# Patient Record
Sex: Female | Born: 1940 | Race: White | Hispanic: No | Marital: Single | Smoking: Former smoker
Health system: Southern US, Community
[De-identification: ages and names within clinical notes are randomized; demographics above are authoritative.]

## PROBLEM LIST (undated history)

## (undated) DIAGNOSIS — I499 Cardiac arrhythmia, unspecified: Secondary | ICD-10-CM

## (undated) DIAGNOSIS — K219 Gastro-esophageal reflux disease without esophagitis: Secondary | ICD-10-CM

## (undated) DIAGNOSIS — I509 Heart failure, unspecified: Secondary | ICD-10-CM

## (undated) DIAGNOSIS — E785 Hyperlipidemia, unspecified: Secondary | ICD-10-CM

## (undated) DIAGNOSIS — M1990 Unspecified osteoarthritis, unspecified site: Secondary | ICD-10-CM

## (undated) DIAGNOSIS — R9431 Abnormal electrocardiogram [ECG] [EKG]: Secondary | ICD-10-CM

## (undated) DIAGNOSIS — I10 Essential (primary) hypertension: Secondary | ICD-10-CM

## (undated) DIAGNOSIS — E871 Hypo-osmolality and hyponatremia: Secondary | ICD-10-CM

## (undated) HISTORY — PX: TONSILLECTOMY: SUR1361

## (undated) HISTORY — PX: CATARACT EXTRACTION: SUR2

## (undated) HISTORY — PX: JOINT REPLACEMENT: SHX530

## (undated) HISTORY — DX: Heart failure, unspecified: I50.9

## (undated) HISTORY — PX: CATARACT EXTRACTION, BILATERAL: SHX1313

## (undated) HISTORY — PX: BREAST SURGERY: SHX581

## (undated) HISTORY — PX: BALLOON SINUPLASTY: SHX5740

## (undated) HISTORY — DX: Cardiac arrhythmia, unspecified: I49.9

## (undated) HISTORY — DX: Hyperlipidemia, unspecified: E78.5

---

## 2006-11-22 ENCOUNTER — Inpatient Hospital Stay (HOSPITAL_COMMUNITY): Admission: RE | Admit: 2006-11-22 | Discharge: 2006-11-25 | Payer: Self-pay | Admitting: Orthopedic Surgery

## 2010-10-14 NOTE — Op Note (Signed)
Erin, [name] NO.:  [id number]   MEDICAL RECORD NO.:  [id number]          PATIENT TYPE:  INP   LOCATION:  NA                           FACILITY:  [facility]   PHYSICIAN:  Erin [name], M.D.    DATE OF BIRTH:  04-04-1941   DATE OF PROCEDURE:  11/22/2006  DATE OF DISCHARGE:                               OPERATIVE REPORT   PREOPERATIVE DIAGNOSIS:  Osteoarthritis, right hip.   POSTOPERATIVE DIAGNOSIS:  Osteoarthritis, right hip.   PROCEDURE:  Right total hip arthroplasty.   SURGEON:  Erin [name], M.D.   ASSISTANT:  Erin [name], P.A.C.   ANESTHESIA:  General.   BLOOD LOSS:  300.   DRAINS:  None.   COMPLICATIONS:  None.   CONDITION:  Stable to the recovery room.   CLINICAL NOTE:  [name] is a 70 year old female with end-stage  arthritis of the right hip, progressively worsening pain and  dysfunction.  She presents now for right total hip arthroplasty.   PROCEDURE IN DETAIL:  After successful initiation of general anesthetic,  the patient was placed in the left lateral decubitus position with the  right side up and held with the hip positioner.  The right lower  extremity was isolated from the perineum with plastic drapes and prepped  and draped in the usual sterile fashion.   A short posterolateral incision was made with 10 blade through the  subcutaneous tissue to the level of the fascia lata which was incised in  line with the skin incision.  The sciatic nerve was palpated and  protected, and the short external rotators were isolated off the femur.  A capsulectomy was performed, and the hip was dislocated.  The center of  the femoral head was marked, and a trial prosthesis was placed such that  the center of the trial head corresponds to the center of her native  femoral head.  Osteotomy line was marked on the femoral neck and  osteotomy made with an oscillating saw.  The femoral head was removed  and the femur retracted  anteriorly to gain acetabular exposure.   Acetabular retractors were placed and the labrum and osteophytes  removed.  Reaming starts at 45 mm, coursing in increments of 2 to 49 mm,  and a 50-mm pinnacle acetabular shell was placed in anatomic position  and transfixed with two dome screws.  The permanent apex hole eliminator  was placed, and the permanent 36-mm neutral Ultramet liner was placed.   The femur was prepared with the canal finder and irrigation.  Axial  reaming was performed up to 15.5 mm, proximal reaming to 20Da and the  sleeve machine to a small.  The 20D small trial sleeve was placed with  20 x 15 stem and a 36 +8 neck, matching her native anteversion.  A 36 +  0 head is placed.  The hip is reduced, with excellent stability.  There  was full extension, full external rotation, 70 degrees flexion, 40  degrees adduction, 90 degrees internal rotation, 90 degrees of flexion,  70 degrees of internal rotation.  By placing the right  leg on top of the  left, it felt as though the leg lengths were equal.  The hip was then  dislocated and the trials removed.  The permanent 20D small sleeve is  placed with a 20 x 15 stem, 36 + 8 neck, matching native anteversion.  The permanent 36-0 head was placed.  The hip was reduced with the same  stability parameters.  The wound was copiously irrigated with saline  solution and the short rotators reattached to the femur through drill  holes.  The fascia lata was closed over a Hemovac drain with interrupted  #1 Vicryl.  The subcutaneous tissue was closed with #1-0 and #2-0  Vicryl, and then the subcuticular closed with running 4-0 Monocryl.  The  incision was then cleaned and dried and Steri-Strips and bulky sterile  dressing applied.  She was placed into a knee immobilizer, awakened, and  transported to recovery in stable condition.      Erin [name], M.D.  Electronically Signed     FA/MEDQ  D:  11/22/2006  T:  11/22/2006  Job:  [id number]

## 2010-10-14 NOTE — H&P (Signed)
NAMESHAMECKA, HOCUTT NO.:  [id number]   MEDICAL RECORD NO.:  [id number]          PATIENT TYPE:  INP   LOCATION:  NA                           FACILITY:  [facility]   PHYSICIAN:  Ollen Gross, M.D.    DATE OF BIRTH:  Oct 24, 1940   DATE OF ADMISSION:  11/22/2006  DATE OF DISCHARGE:                              HISTORY & PHYSICAL   CHIEF COMPLAINT:  Right hip pain.   HISTORY OF PRESENT ILLNESS:  Erin [name] is a  70 year old female who has  been evaluated Dr. Lequita Halt for ongoing right hip pain. It is  progressively getting worse over the past year now. It feels like it is  giving out on her. It is having a negative impact on her life.  She said  cortisone injection would only help temporarily.  She was seen in the  office by Dr. Lequita Halt and found to have end-stage arthritis with bone-on-  bone with erosion of femoral head and large subchondral cystic changes  in the femoral head and acetabulum.  It was felt she has reached a point  where she would benefit from undergoing surgical intervention.  Risks  and benefits been discussed. She elects to proceed with surgery.   ALLERGIES:  PENICILLIN causes hives.   CURRENT MEDICATIONS:  Lisinopril/hydrochlorothiazide 10/12 .5, aspirin  81 mg, glucosamine 1000 mg, ibuprofen 200 mg.   PAST MEDICAL HISTORY:  Mild hypertension, postmenopausal.   PAST SURGICAL HISTORY:  Breast biopsy benign.   SOCIAL HISTORY:  Divorced, Designer, jewellery, nonsmoker.  No alcohol.  Two children.  Son and some of her friends will be assisting with care  after surgery.   FAMILY HISTORY:  Brother with history of lung cancer.   REVIEW OF SYSTEMS:  GENERAL:  No fevers, chills, night sweats.  NEURO:  No seizures, syncope  or paralysis.  RESPIRATORY:  No shortness of  breath, productive cough or hemoptysis.  CARDIOVASCULAR: No chest pain,  angina or orthopnea.  GI: No nausea, vomiting, diarrhea or constipation.  GU: No dysuria, hematuria or discharge.   MUSCULOSKELETAL: Right hip.   PHYSICAL EXAM:  Vital Signs: Pulse in the 70s, respirations 12, blood  pressure 128/78.  GENERAL:  A 70 year old, petite white female, well-nourished, well-  developed, no acute distress.  Excellent historian, alert and  cooperative.  HEENT: Normocephalic, atraumatic.  Pupils round and reactive.  Oropharynx clear.  EOMs intact.  NECK:  Supple.  CHEST:  Clear.  HEART: Regular rate and rhythm with early faint systolic ejection  murmur, S1-S2 noted.  ABDOMEN: Soft, nontender.  Bowel sounds present,  rectal and genitalia not done not pertinent to present illness.  EXTREMITIES:  Right hip: right hip shows flexion 90,  internal rotation  zero, external rotation 10-15 degrees abduction.   IMPRESSION:  1. Osteoarthritis right hip  2. Mild hypertension.  3. Postmenopausal.   PLAN:  The patient admitted to [facility] to undergo a right  total hip replacement arthroplasty.  Surgery will be performed by Ollen Gross.      Alexzandrew L. Perkins, P.A.C.      Ollen Gross, M.D.  Electronically Signed    ALP/MEDQ  D:  11/21/2006  T:  11/21/2006  Job:  [id number]   cc:   Ollen Gross, M.D.  Fax: [phone]

## 2010-10-14 NOTE — Discharge Summary (Signed)
Erin [name]               ACCOUNT NO.:  [id number]   MEDICAL RECORD NO.:  [id number]          PATIENT TYPE:  INP   LOCATION:  1609                         FACILITY:  [facility]   PHYSICIAN:  Ollen Gross, M.D.    DATE OF BIRTH:  [birth year]   DATE OF ADMISSION:  11/22/2006  DATE OF DISCHARGE:  11/25/2006                               DISCHARGE SUMMARY   ADMISSION DIAGNOSES:  1. Osteoarthritis of the right hip.  2. Mild hypertension.  3. Postmenopausal.  4. Mild hyponatremia, per preoperative labs.   DISCHARGE DIAGNOSES:  1. Osteoarthritis of the right hip, status post total right hip      arthroplasty.  2. Mild postoperative blood loss anemia, did not require transfusion.  3. Preoperative hyponatremia, improving.  4. Mild hypertension.  5. Postmenopausal.  6. Mild hyponatremia, per preoperative labs.   PROCEDURE:  On November 22, 2006, right total hip surgery, Dr. Lequita Halt,  assistant Cammy Copa, PA-C, anesthesia general.   CONCLUSION:  None.   BRIEF HISTORY:  Erin [name] is a 70 year old female with end-stage  arthritis of the right hip, progressively worsening pain and  dysfunction, now presents for total hip arthroplasty.   LABORATORY DATA:  Preoperative CBC showed a hemoglobin of 14.5,  hematocrit of 42.6, white cell count of 5900 and came back up to 9800.  Last hemoglobin and hematocrit 9.7 and 28.0.  Preoperative PTT 12.6 and  26 respectively.  INR 0.9.  Serum protimes followed.  Last noted PT INR,  prior to discharge, was 50.9 and 1.2.  Chem panel on admission showed a  low sodium of 130, low chloride of 90, glucose 110, slightly elevated  ALT of 26.  Remaining chem panel within normal limits.  __________  follow electrolytes.  The sodium came back to 133, last noted at 135 and  normal level.  Preoperative urinalysis negative, __________ A positive.  X-rays, portable hip and pelvic film on November 22, 2006, status post right  total hip.   Electrocardiogram November 16, 2006, normal sinus rhythm, normal  electrocardiogram confirmed by Dr. Lacretia Nicks. Erin [name].   HOSPITAL COURSE:  The patient was admitted to [facility] and  tolerated the procedure well.  Later to recovery room on the orthopedic  floor, started on PCMP analgesia for pain control following surgery.  Did fairly well, had a good night, did have some pain and spasm.  Encouraged p.o. meds.  PCA was discontinued.  Hemoglobin was down a  little bit at a 9, probably dilutional, started on iron.  Potassium was  a little low and also felt to be dilutional, started on potassium  supplements.  We removed the Foley later that afternoon.  By the next  day, it was noted that her preoperative low sodium had already come back  up.  Potassium responded, came up to 4.5.  Dressing change, incision  looked good.  From a therapy standpoint, she did pretty well.  She was  actually getting up and walking short distances with only minimal  assistance and guarded.  She continued to progress well and by the  following day,  postoperative day 3, the incision was healing well and  she was tolerating her medications.  The sodium was normal.  The  potassium was normal.  She was discharged home.   DISCHARGE PLAN:  1. Discharge home on November 25, 2006.  2. Discharge diagnosis, please see above.  3. Discharge medications:  Coumadin, new iron, Percocet, Robaxin.  4. Diet as tolerated.   FOLLOWUP:  Follow up the week of July 7 in the office with PA.  Call for  an appointment time, any day that Monday through Thursday.   ACTIVITY:  Partial weightbearing, 25% to 50%.  Hip precautions.  Home  health physical therapy.  Home health nursing.  Total hip protocol.   DISPOSITION:  Home.   CONDITION ON DISCHARGE:  Discharged improved.      Erin [name], P.A.C.      Ollen Gross, M.D.  Electronically Signed    ALP/MEDQ  D:  11/25/2006  T:  11/25/2006  Job:  [id number]

## 2011-03-18 LAB — BASIC METABOLIC PANEL WITH GFR
BUN: 4 — ABNORMAL LOW
BUN: 8
CO2: 30
CO2: 32
Calcium: 8.6
Calcium: 8.9
Chloride: 100
Chloride: 95 — ABNORMAL LOW
Creatinine, Ser: 0.5
Creatinine, Ser: 0.65
GFR calc non Af Amer: >60
GFR calc non Af Amer: >60
Glucose, Bld: 103 — ABNORMAL HIGH
Glucose, Bld: 113 — ABNORMAL HIGH
Potassium: 3.2 — ABNORMAL LOW
Potassium: 4.2
Sodium: 135
Sodium: 137

## 2011-03-18 LAB — PROTIME-INR
INR: 0.9
INR: 1.1
INR: 1.2
Prothrombin Time: 12.6
Prothrombin Time: 14.3
Prothrombin Time: 15.8 — ABNORMAL HIGH

## 2011-03-18 LAB — URINALYSIS, ROUTINE W REFLEX MICROSCOPIC
Bilirubin Urine: NEGATIVE
Glucose, UA: NEGATIVE
Hgb urine dipstick: NEGATIVE
Ketones, ur: NEGATIVE
Nitrite: NEGATIVE
Protein, ur: NEGATIVE
Specific Gravity, Urine: 1.01
Urobilinogen, UA: 0.2
pH: 6.5

## 2011-03-18 LAB — COMPREHENSIVE METABOLIC PANEL
BUN: 6
CO2: 29
Calcium: 9.8
Chloride: 90 — ABNORMAL LOW
Creatinine, Ser: 0.61
GFR calc non Af Amer: >60
Glucose, Bld: 110 — ABNORMAL HIGH
Total Bilirubin: 0.7

## 2011-03-18 LAB — CBC
HCT: 27.6 — ABNORMAL LOW
HCT: 28 — ABNORMAL LOW
HCT: 42.6
Hemoglobin: 9.7 — ABNORMAL LOW
Hemoglobin: 9.8 — ABNORMAL LOW
MCHC: 34.1
MCHC: 34.6
MCHC: 35.2
MCHC: 35.3
MCV: 86
MCV: 96.6
MCV: 96.9
MCV: 97
Platelets: 181
Platelets: 185
RBC: 2.86 — ABNORMAL LOW
RBC: 2.89 — ABNORMAL LOW
RBC: 2.97 — ABNORMAL LOW
RBC: 4.39
RDW: 12.6
RDW: 12.9
RDW: 14.1 — ABNORMAL HIGH
WBC: 3.9 — ABNORMAL LOW
WBC: 3.9 — ABNORMAL LOW
WBC: 5.4

## 2011-03-18 LAB — CROSSMATCH: ABO/RH(D): A POS

## 2011-03-18 LAB — BASIC METABOLIC PANEL
BUN: 3 — ABNORMAL LOW
Chloride: 99
GFR calc non Af Amer: >60
Glucose, Bld: 125 — ABNORMAL HIGH
Potassium: 4.5

## 2011-03-18 LAB — APTT: aPTT: 26

## 2013-07-06 ENCOUNTER — Other Ambulatory Visit: Payer: Self-pay | Admitting: Orthopedic Surgery

## 2013-07-06 NOTE — H&P (Signed)
Erin [name]  DOB: 08-25-1940 Single / Language: Lenox Ponds[place] / Race: White Female  Date of Admission:  07-19-2013  Chief Complaint:  Severe Left Hip Pain  History of Present Illness The patient is a 73 year old female who comes in for a preoperative History and Physical. The patient is scheduled for a left total hip arthroplasty (anterior approach) to be performed by Dr. Gus RankinFrank V. Aluisio, MD at [facility] on 07-19-2013. The patient is a 73 year old female who presents today for follow up of their hip. The patient is being followed for their left hip pain and osteoarthritis. They are several week(s) out from intra-articular injection with Dr. Ethelene [name]. Symptoms reported today include: pain and difficulty ambulating. The patient feels that they are doing poorly and report their pain level to be severe. Current treatment includes: relative rest and pain medications. The following medication has been used for pain control: Tylenol with codeine. The patient has not gotten any relief of their symptoms with corticosteroid use or Cortisone injections. Erin Molelice comes in today for a recheck of her left hip pain. 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 She has been on a walker. She's also seen by Dr. Ethelene [name] for her back. She was scheduled to have an MRI but, due to the significant pain in her hip, she was unable to complete that. She has been on a Dosepak. This provided no improvement with her left hip pain. She's had no injury since we saw her the beginning of December but reports that "something is different" about the hip. She realizes she has a bad hip but wanted to come in and get it checked due to the significant increase in discomfort that she's having. Most of her pain is related in her groin and radiating down the anterior thigh. She is finding it very difficult to weight bear. her pain has progressed tot hte point where she is  now essentially wheelchair bound to get around. Family has come in to help her at home and to be with her following her surgery. She is ready to get the left hip replaced now. They have been treated conservatively in the past for the above stated problem and despite conservative measures, they continue to have progressive pain and severe functional limitations and dysfunction. They have failed non-operative management including home exercise, medications, and injections. It is felt that they would benefit from undergoing total joint replacement. Risks and benefits of the procedure have been discussed with the patient and they elect to proceed with surgery. There are no active contraindications to surgery such as ongoing infection or rapidly progressive neurological disease.  Allergies Penicillins. 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 Doing very well with regards to the right hip surgery. 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 Mother. Osteoarthritis. Brother.  Social History Children. 2 Current drinker. 04/08/2013: Currently drinks beer less than 5 times per week Current work status. retired - Audiological scientistN Exercise. Exercises weekly; does gym / weights Living situation. live alone Marital status. divorced Number of flights of stairs before winded. greater than 5 Tobacco use. Former smoker. 04/08/2013: smoke(d) 3/4 pack(s) per day Not under pain contract No history of drug/alcohol rehab Tobacco / smoke exposure. 04/08/2013: no Post-Surgical Plans. Plan for home with family  Medication History Lisinopril (40MG   Tablet, 1 Oral daily) Active. Aspirin (81MG  Tablet, 1 Oral daily) Active. Allegra Allergy (180MG  Tablet, 1 Oral daily) Active.  Past Surgical  History Total Hip Replacement. right Cataract Surgery. Date: 2013. bilateral  Review of Systems General:Not Present- Chills, Fever, Night Sweats, Fatigue, Weight Gain, Weight Loss and Memory Loss. Skin:Not Present- Hives, Itching, Rash, Eczema and Lesions. HEENT:Not Present- Tinnitus, Headache, Double Vision, Visual Loss, Hearing Loss and Dentures. Respiratory:Not Present- Shortness of breath with exertion, Shortness of breath at rest, Allergies, Coughing up blood and Chronic Cough. Cardiovascular:Not Present- Chest Pain, Racing/skipping heartbeats, Difficulty Breathing Lying Down, Murmur, Swelling and Palpitations. Gastrointestinal:Not Present- Bloody Stool, Heartburn, Abdominal Pain, Vomiting, Nausea, Constipation, Diarrhea, Difficulty Swallowing, Jaundice and Loss of appetitie. Female Genitourinary:Not Present- Blood in Urine, Urinary frequency, Weak urinary stream, Discharge, Flank Pain, Incontinence, Painful Urination, Urgency, Urinary Retention and Urinating at Night. Musculoskeletal:Present- Joint Pain (left hip). Not Present- Muscle Weakness, Muscle Pain, Joint Swelling, Back Pain, Morning Stiffness and Spasms. Neurological:Not Present- Tremor, Dizziness, Blackout spells, Paralysis, Difficulty with balance and Weakness. Psychiatric:Not Present- Insomnia.   Vitals Pulse: 86 (Regular) Resp.: 12 (Unlabored) BP: 126/64 (Sitting, Left Arm, Standard)  Physical Exam The physical exam findings are as follows:   General Mental Status - Alert, cooperative and good historian. General Appearance- pleasant. Not in acute distress. Orientation- Oriented X3. Build & Nutrition- Petite, Well nourished and Well developed. Gait- Use of assistive device (essemtially wheelchair bound at this time due to the progression of her hip).   Head and Neck Head- normocephalic, atraumatic . Neck Global Assessment- supple. no bruit auscultated on the right and no bruit  auscultated on the left.   Eye Vision- Wears corrective lenses. Pupil- Bilateral- Regular and Round. Motion- Bilateral- EOMI.   Chest and Lung Exam Auscultation: Breath sounds:- clear at anterior chest wall and - clear at posterior chest wall. Adventitious sounds:- No Adventitious sounds.   Cardiovascular Auscultation:Rhythm- Regular rate and rhythm (with the exception of occassional extra or ectopic beat noted on exam). Heart Sounds- S1 WNL and S2 WNL. Murmurs & Other Heart Sounds:Auscultation of the heart reveals - No Murmurs.   Abdomen Palpation/Percussion:Tenderness- Abdomen is non-tender to palpation. Rigidity (guarding)- Abdomen is soft. Auscultation:Auscultation of the abdomen reveals - Bowel sounds normal.   Female Genitourinary Not done, not pertinent to present illness  Musculoskeletal  On physical exam, she has significant discomfort with passive and active ROM. Due to the significant discomfort, ROM was not measured. She is able to fully extend the knee without difficulty. No effusion or instability about the knee. Calves are soft and nontender. Distal pulses are 2+. She does have normal painless ROM in the right hip. No specific tenderness to palpation over the greater trochanteric bursa on either side.  Radiographs - she's had extreme progression of this disease to the point where she has essentially completely necrosed the femoral head on that left side. No fractures noted.   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  Note: Plan is for a Left Total Hip Replacement - Anterior Approach by Dr. Lequita Halt.  Plan is to go ome with family  PCP - Dr. Gavin Potters  The patient does not have any contraindications and will receive TXA (tranexamic acid) prior to surgery.  Signed electronically by Lauraine Rinne, III PA-C

## 2013-07-10 ENCOUNTER — Encounter (HOSPITAL_COMMUNITY): Payer: Self-pay | Admitting: Pharmacy Technician

## 2013-07-13 ENCOUNTER — Ambulatory Visit (HOSPITAL_COMMUNITY)
Admission: RE | Admit: 2013-07-13 | Discharge: 2013-07-13 | Disposition: A | Discharge status: Home / Self Care | Payer: Medicare Other | Source: Ambulatory Visit | Attending: Orthopedic Surgery | Admitting: Orthopedic Surgery

## 2013-07-13 ENCOUNTER — Encounter (HOSPITAL_COMMUNITY)
Admission: RE | Admit: 2013-07-13 | Discharge: 2013-07-13 | Disposition: A | Discharge status: Home / Self Care | Payer: Medicare Other | Source: Ambulatory Visit | Attending: Orthopedic Surgery | Admitting: Orthopedic Surgery

## 2013-07-13 ENCOUNTER — Encounter (HOSPITAL_COMMUNITY): Payer: Self-pay

## 2013-07-13 DIAGNOSIS — Z01818 Encounter for other preprocedural examination: Secondary | ICD-10-CM | POA: Insufficient documentation

## 2013-07-13 DIAGNOSIS — M1990 Unspecified osteoarthritis, unspecified site: Secondary | ICD-10-CM

## 2013-07-13 DIAGNOSIS — R9431 Abnormal electrocardiogram [ECG] [EKG]: Secondary | ICD-10-CM

## 2013-07-13 DIAGNOSIS — E871 Hypo-osmolality and hyponatremia: Secondary | ICD-10-CM

## 2013-07-13 DIAGNOSIS — M1610 Unilateral primary osteoarthritis, unspecified hip: Secondary | ICD-10-CM | POA: Insufficient documentation

## 2013-07-13 DIAGNOSIS — Z01812 Encounter for preprocedural laboratory examination: Secondary | ICD-10-CM | POA: Insufficient documentation

## 2013-07-13 DIAGNOSIS — M169 Osteoarthritis of hip, unspecified: Secondary | ICD-10-CM | POA: Insufficient documentation

## 2013-07-13 HISTORY — DX: Abnormal electrocardiogram (ECG) (EKG): R94.31

## 2013-07-13 HISTORY — DX: Gastro-esophageal reflux disease without esophagitis: K21.9

## 2013-07-13 HISTORY — DX: Essential (primary) hypertension: I10

## 2013-07-13 HISTORY — DX: Hypo-osmolality and hyponatremia: E87.1

## 2013-07-13 HISTORY — DX: Unspecified osteoarthritis, unspecified site: M19.90

## 2013-07-13 LAB — COMPREHENSIVE METABOLIC PANEL
ALK PHOS: 67 U/L (ref 39–117)
ALT: 21 U/L (ref 0–35)
AST: 17 U/L (ref 0–37)
Albumin: 3.9 g/dL (ref 3.5–5.2)
BILIRUBIN TOTAL: 0.3 mg/dL (ref 0.3–1.2)
BUN: 11 mg/dL (ref 6–23)
CO2: 25 meq/L (ref 19–32)
Calcium: 10.1 mg/dL (ref 8.4–10.5)
Chloride: 89 mEq/L — ABNORMAL LOW (ref 96–112)
Creatinine, Ser: 0.58 mg/dL (ref 0.50–1.10)
GFR calc non Af Amer: 89 mL/min — ABNORMAL LOW (ref >90)
GLUCOSE: 111 mg/dL — AB (ref 70–99)
POTASSIUM: 4.7 meq/L (ref 3.7–5.3)
Sodium: 130 mEq/L — ABNORMAL LOW (ref 137–147)
Total Protein: 7.1 g/dL (ref 6.0–8.3)

## 2013-07-13 LAB — URINALYSIS, ROUTINE W REFLEX MICROSCOPIC
Bilirubin Urine: NEGATIVE
Glucose, UA: NEGATIVE mg/dL
HGB URINE DIPSTICK: NEGATIVE
Ketones, ur: NEGATIVE mg/dL
Leukocytes, UA: NEGATIVE
NITRITE: NEGATIVE
Protein, ur: NEGATIVE mg/dL
SPECIFIC GRAVITY, URINE: 1.019 (ref 1.005–1.030)
Urobilinogen, UA: 0.2 mg/dL (ref 0.0–1.0)
pH: 7 (ref 5.0–8.0)

## 2013-07-13 LAB — CBC
HEMATOCRIT: 38.4 % (ref 36.0–46.0)
HEMOGLOBIN: 13.2 g/dL (ref 12.0–15.0)
MCH: 32.1 pg (ref 26.0–34.0)
MCHC: 34.4 g/dL (ref 30.0–36.0)
MCV: 93.4 fL (ref 78.0–100.0)
Platelets: 275 10*3/uL (ref 150–400)
RBC: 4.11 MIL/uL (ref 3.87–5.11)
RDW: 12.9 % (ref 11.5–15.5)
WBC: 6.6 10*3/uL (ref 4.0–10.5)

## 2013-07-13 LAB — SURGICAL PCR SCREEN
MRSA, PCR: NEGATIVE
Staphylococcus aureus: NEGATIVE

## 2013-07-13 LAB — PROTIME-INR
INR: 0.93 (ref 0.00–1.49)
PROTHROMBIN TIME: 12.3 s (ref 11.6–15.2)

## 2013-07-13 LAB — APTT: APTT: 33 s (ref 24–37)

## 2013-07-13 NOTE — Pre-Procedure Instructions (Addendum)
07-13-13 EKG 12'14,Echo 1'15-reports with chart. CXR today. Left hip Xray today.

## 2013-07-13 NOTE — Progress Notes (Signed)
07-13-13 1620 labs viewable in Epic-please note Sodium.

## 2013-07-13 NOTE — Patient Instructions (Addendum)
20 Timmothy Eulerlice S Alcantar  07/13/2013   Your procedure is scheduled on:2-18   -2015  [facility] at    2:00 PM.  Call this number if you have problems the morning of surgery: [phone]  Or Presurgical Testing [phone](Lennyn Bellanca) For Living Will and/or Health Care Power Attorney Forms: please provide copy for your medical record,may bring AM of surgery(Forms should be already notarized -we do not provide this service).  Remember: Follow any bowel prep instructions per MD office. For Cpap use: Bring mask and tubing only.   Do not eat food:After Midnight.  May have clear liquids:up to 6 Hours before arrival. Nothing after : 1100 AM  Clear liquids include soda, tea, black coffee, apple or grape juice, broth.  Take these medicines the morning of surgery with A SIP OF WATER: Tylenol 3#, Allegra, Fentanyl Transdermal Patch   Do not wear jewelry, make-up or nail polish.  Do not wear lotions, powders, or perfumes. You may wear deodorant.  Do not shave 48 hours(2 days) prior to first CHG shower(legs and under arms).(Shaving face and neck okay.)  Do not bring valuables to the hospital.(Hospital is not responsible for lost valuables).  Contacts, dentures or removable bridgework, body piercing, hair pins may not be worn into surgery.  Leave suitcase in the car. After surgery it may be brought to your room.  For patients admitted to the hospital, checkout time is 11:00 AM the day of discharge.(Restricted visitors-Any Persons displaying flu-like symptoms or illness).    Patients discharged the day of surgery will not be allowed to drive home. Must have responsible person with you x 24 hours once discharged.  Name and phone number of your driver: Esmeralda ArthurPam Klein, sister-in-law- will provide #  Special Instructions: CHG(Chlorhedine 4%-"Hibiclens","Betasept","Aplicare") Shower Use Special Wash: see special instructions.(avoid face and genitals)   Please read over the following fact sheets  that you were given: MRSA Information, Blood Transfusion fact sheet, Incentive Spirometry Instruction.  Remember : Type/Screen "Blue armbands" - may not be removed once applied(would result in being retested AM of surgery, if removed).  Failure to follow these instructions may result in Cancellation of your surgery.   Patient signature_______________________________________________________

## 2013-07-19 ENCOUNTER — Inpatient Hospital Stay (HOSPITAL_COMMUNITY): Payer: Medicare Other | Admitting: Anesthesiology

## 2013-07-19 ENCOUNTER — Inpatient Hospital Stay (HOSPITAL_COMMUNITY): Payer: Medicare Other

## 2013-07-19 ENCOUNTER — Encounter (HOSPITAL_COMMUNITY): Payer: Self-pay | Admitting: General Practice

## 2013-07-19 ENCOUNTER — Encounter (HOSPITAL_COMMUNITY): Payer: Medicare Other | Admitting: Anesthesiology

## 2013-07-19 ENCOUNTER — Inpatient Hospital Stay (HOSPITAL_COMMUNITY)
Admission: RE | Admit: 2013-07-19 | Discharge: 2013-07-21 | DRG: 470 | Disposition: A | Discharge status: Home Health | Payer: Medicare Other | Source: Ambulatory Visit | Attending: Orthopedic Surgery | Admitting: Orthopedic Surgery

## 2013-07-19 ENCOUNTER — Encounter (HOSPITAL_COMMUNITY)
Admission: RE | Disposition: A | Discharge status: Home Health | Payer: Self-pay | Source: Ambulatory Visit | Attending: Orthopedic Surgery

## 2013-07-19 DIAGNOSIS — I10 Essential (primary) hypertension: Secondary | ICD-10-CM | POA: Diagnosis present

## 2013-07-19 DIAGNOSIS — M169 Osteoarthritis of hip, unspecified: Secondary | ICD-10-CM | POA: Diagnosis present

## 2013-07-19 DIAGNOSIS — E871 Hypo-osmolality and hyponatremia: Secondary | ICD-10-CM | POA: Diagnosis present

## 2013-07-19 DIAGNOSIS — M1610 Unilateral primary osteoarthritis, unspecified hip: Principal | ICD-10-CM | POA: Diagnosis present

## 2013-07-19 DIAGNOSIS — D62 Acute posthemorrhagic anemia: Secondary | ICD-10-CM | POA: Diagnosis not present

## 2013-07-19 DIAGNOSIS — K219 Gastro-esophageal reflux disease without esophagitis: Secondary | ICD-10-CM | POA: Diagnosis present

## 2013-07-19 DIAGNOSIS — Z96642 Presence of left artificial hip joint: Secondary | ICD-10-CM

## 2013-07-19 DIAGNOSIS — R9431 Abnormal electrocardiogram [ECG] [EKG]: Secondary | ICD-10-CM | POA: Diagnosis present

## 2013-07-19 DIAGNOSIS — IMO0002 Reserved for concepts with insufficient information to code with codable children: Secondary | ICD-10-CM

## 2013-07-19 DIAGNOSIS — Z87891 Personal history of nicotine dependence: Secondary | ICD-10-CM

## 2013-07-19 HISTORY — PX: TOTAL HIP ARTHROPLASTY: SHX124

## 2013-07-19 LAB — TYPE AND SCREEN
ABO/RH(D): A POS
Antibody Screen: NEGATIVE

## 2013-07-19 SURGERY — ARTHROPLASTY, HIP, TOTAL, ANTERIOR APPROACH
Anesthesia: General | Site: Hip | Laterality: Left

## 2013-07-19 MED ORDER — MORPHINE SULFATE 2 MG/ML IJ SOLN: 1.0000 mg | INTRAMUSCULAR | Status: DC | PRN

## 2013-07-19 MED ORDER — ACETAMINOPHEN 325 MG PO TABS: 650.0000 mg | ORAL_TABLET | Freq: Four times a day (QID) | ORAL | Status: DC | PRN

## 2013-07-19 MED ORDER — METHOCARBAMOL 500 MG PO TABS
500.0000 mg | ORAL_TABLET | Freq: Four times a day (QID) | ORAL | Status: DC | PRN
  Administered 2013-07-20 – 2013-07-21 (×4): 500 mg via ORAL
  Filled 2013-07-19 (×5): qty 1

## 2013-07-19 MED ORDER — BUPIVACAINE LIPOSOME 1.3 % IJ SUSP
INTRAMUSCULAR | Status: DC | PRN
  Administered 2013-07-19: 20 mL

## 2013-07-19 MED ORDER — BISACODYL 10 MG RE SUPP: 10.0000 mg | Freq: Every day | RECTAL | Status: DC | PRN

## 2013-07-19 MED ORDER — DEXAMETHASONE SODIUM PHOSPHATE 10 MG/ML IJ SOLN
10.0000 mg | Freq: Every day | INTRAMUSCULAR | Status: AC
  Filled 2013-07-19: qty 1

## 2013-07-19 MED ORDER — OXYCODONE HCL 5 MG PO TABS
5.0000 mg | ORAL_TABLET | ORAL | Status: DC | PRN
  Administered 2013-07-19: 5 mg via ORAL
  Administered 2013-07-20: 10 mg via ORAL
  Administered 2013-07-20: 5 mg via ORAL
  Administered 2013-07-20 (×2): 10 mg via ORAL
  Administered 2013-07-20: 5 mg via ORAL
  Administered 2013-07-20 – 2013-07-21 (×4): 10 mg via ORAL
  Filled 2013-07-19 (×9): qty 2

## 2013-07-19 MED ORDER — SODIUM CHLORIDE 0.9 % IJ SOLN
INTRAMUSCULAR | Status: AC
  Filled 2013-07-19: qty 50

## 2013-07-19 MED ORDER — KETOROLAC TROMETHAMINE 15 MG/ML IJ SOLN
7.5000 mg | Freq: Four times a day (QID) | INTRAMUSCULAR | Status: AC | PRN
  Administered 2013-07-20: 7.5 mg via INTRAVENOUS
  Filled 2013-07-19: qty 1

## 2013-07-19 MED ORDER — ATROPINE SULFATE 0.4 MG/ML IJ SOLN
INTRAMUSCULAR | Status: AC
  Filled 2013-07-19: qty 1

## 2013-07-19 MED ORDER — DEXAMETHASONE 4 MG PO TABS
10.0000 mg | ORAL_TABLET | Freq: Every day | ORAL | Status: AC
  Administered 2013-07-20: 10 mg via ORAL
  Filled 2013-07-19: qty 1

## 2013-07-19 MED ORDER — EPHEDRINE SULFATE 50 MG/ML IJ SOLN
INTRAMUSCULAR | Status: AC
  Filled 2013-07-19: qty 1

## 2013-07-19 MED ORDER — HYDROMORPHONE HCL PF 1 MG/ML IJ SOLN
INTRAMUSCULAR | Status: DC | PRN
  Administered 2013-07-19 (×2): 1 mg via INTRAVENOUS

## 2013-07-19 MED ORDER — FLEET ENEMA 7-19 GM/118ML RE ENEM: 1.0000 | ENEMA | Freq: Once | RECTAL | Status: AC | PRN

## 2013-07-19 MED ORDER — PROPOFOL 10 MG/ML IV BOLUS
INTRAVENOUS | Status: DC | PRN
  Administered 2013-07-19: 150 mg via INTRAVENOUS
  Administered 2013-07-19: 50 mg via INTRAVENOUS

## 2013-07-19 MED ORDER — ONDANSETRON HCL 4 MG/2ML IJ SOLN
INTRAMUSCULAR | Status: AC
  Filled 2013-07-19: qty 2

## 2013-07-19 MED ORDER — DOCUSATE SODIUM 100 MG PO CAPS
100.0000 mg | ORAL_CAPSULE | Freq: Two times a day (BID) | ORAL | Status: DC
  Administered 2013-07-19 – 2013-07-20 (×3): 100 mg via ORAL

## 2013-07-19 MED ORDER — SODIUM CHLORIDE 0.9 % IV SOLN
INTRAVENOUS | Status: DC
Start: 2013-07-19 — End: 2013-07-19

## 2013-07-19 MED ORDER — ONDANSETRON HCL 4 MG/2ML IJ SOLN
INTRAMUSCULAR | Status: DC | PRN
  Administered 2013-07-19: 4 mg via INTRAVENOUS

## 2013-07-19 MED ORDER — METHOCARBAMOL 100 MG/ML IJ SOLN
500.0000 mg | Freq: Four times a day (QID) | INTRAVENOUS | Status: DC | PRN
  Administered 2013-07-19 – 2013-07-20 (×2): 500 mg via INTRAVENOUS
  Filled 2013-07-19 (×2): qty 5

## 2013-07-19 MED ORDER — CHLORHEXIDINE GLUCONATE CLOTH 2 % EX PADS
6.0000 | MEDICATED_PAD | Freq: Once | CUTANEOUS | Status: DC
Start: 2013-07-19 — End: 2013-07-19

## 2013-07-19 MED ORDER — VANCOMYCIN HCL IN DEXTROSE 1-5 GM/200ML-% IV SOLN
1000.0000 mg | Freq: Once | INTRAVENOUS | Status: AC
  Administered 2013-07-19: 1000 mg via INTRAVENOUS

## 2013-07-19 MED ORDER — NEOSTIGMINE METHYLSULFATE 1 MG/ML IJ SOLN
INTRAMUSCULAR | Status: DC | PRN
  Administered 2013-07-19: 4 mg via INTRAVENOUS

## 2013-07-19 MED ORDER — FENTANYL CITRATE 0.05 MG/ML IJ SOLN
25.0000 ug | INTRAMUSCULAR | Status: DC | PRN
  Administered 2013-07-19 (×4): 25 ug via INTRAVENOUS

## 2013-07-19 MED ORDER — PROPOFOL 10 MG/ML IV BOLUS
INTRAVENOUS | Status: AC
  Filled 2013-07-19: qty 20

## 2013-07-19 MED ORDER — RIVAROXABAN 10 MG PO TABS
10.0000 mg | ORAL_TABLET | Freq: Every day | ORAL | Status: DC
  Administered 2013-07-20 – 2013-07-21 (×2): 10 mg via ORAL
  Filled 2013-07-19 (×3): qty 1

## 2013-07-19 MED ORDER — DEXAMETHASONE SODIUM PHOSPHATE 10 MG/ML IJ SOLN
INTRAMUSCULAR | Status: AC
  Filled 2013-07-19: qty 1

## 2013-07-19 MED ORDER — GLYCOPYRROLATE 0.2 MG/ML IJ SOLN
INTRAMUSCULAR | Status: DC | PRN
  Administered 2013-07-19: 0.6 mg via INTRAVENOUS

## 2013-07-19 MED ORDER — PHENOL 1.4 % MT LIQD: 1.0000 | OROMUCOSAL | Status: DC | PRN

## 2013-07-19 MED ORDER — VANCOMYCIN HCL IN DEXTROSE 1-5 GM/200ML-% IV SOLN
INTRAVENOUS | Status: AC
  Filled 2013-07-19: qty 200

## 2013-07-19 MED ORDER — SUCCINYLCHOLINE CHLORIDE 20 MG/ML IJ SOLN
INTRAMUSCULAR | Status: DC | PRN
  Administered 2013-07-19: 100 mg via INTRAVENOUS

## 2013-07-19 MED ORDER — POLYETHYLENE GLYCOL 3350 17 G PO PACK: 17.0000 g | PACK | Freq: Every day | ORAL | Status: DC | PRN

## 2013-07-19 MED ORDER — BUPIVACAINE HCL (PF) 0.25 % IJ SOLN
INTRAMUSCULAR | Status: AC
Start: 2013-07-19 — End: 2013-07-19
  Filled 2013-07-19: qty 30

## 2013-07-19 MED ORDER — VANCOMYCIN HCL IN DEXTROSE 1-5 GM/200ML-% IV SOLN
1000.0000 mg | Freq: Two times a day (BID) | INTRAVENOUS | Status: AC
  Administered 2013-07-20: 1000 mg via INTRAVENOUS
  Filled 2013-07-19: qty 200

## 2013-07-19 MED ORDER — LABETALOL HCL 5 MG/ML IV SOLN
INTRAVENOUS | Status: AC
  Filled 2013-07-19: qty 4

## 2013-07-19 MED ORDER — METOCLOPRAMIDE HCL 5 MG/ML IJ SOLN: 5.0000 mg | Freq: Three times a day (TID) | INTRAMUSCULAR | Status: DC | PRN

## 2013-07-19 MED ORDER — FENTANYL CITRATE 0.05 MG/ML IJ SOLN
INTRAMUSCULAR | Status: DC | PRN
  Administered 2013-07-19: 100 ug via INTRAVENOUS
  Administered 2013-07-19: 50 ug via INTRAVENOUS
  Administered 2013-07-19: 100 ug via INTRAVENOUS

## 2013-07-19 MED ORDER — TRANEXAMIC ACID 100 MG/ML IV SOLN
1000.0000 mg | INTRAVENOUS | Status: AC
  Administered 2013-07-19: 1000 mg via INTRAVENOUS
  Filled 2013-07-19: qty 10

## 2013-07-19 MED ORDER — ACETAMINOPHEN 650 MG RE SUPP: 650.0000 mg | Freq: Four times a day (QID) | RECTAL | Status: DC | PRN

## 2013-07-19 MED ORDER — FENTANYL CITRATE 0.05 MG/ML IJ SOLN
INTRAMUSCULAR | Status: AC
  Filled 2013-07-19: qty 2

## 2013-07-19 MED ORDER — KETAMINE HCL 10 MG/ML IJ SOLN
INTRAMUSCULAR | Status: AC
  Filled 2013-07-19: qty 1

## 2013-07-19 MED ORDER — TRAMADOL HCL 50 MG PO TABS: 50.0000 mg | ORAL_TABLET | Freq: Four times a day (QID) | ORAL | Status: DC | PRN

## 2013-07-19 MED ORDER — METOCLOPRAMIDE HCL 10 MG PO TABS: 5.0000 mg | ORAL_TABLET | Freq: Three times a day (TID) | ORAL | Status: DC | PRN

## 2013-07-19 MED ORDER — DIPHENHYDRAMINE HCL 12.5 MG/5ML PO ELIX: 12.5000 mg | ORAL_SOLUTION | ORAL | Status: DC | PRN

## 2013-07-19 MED ORDER — MIDAZOLAM HCL 5 MG/5ML IJ SOLN
INTRAMUSCULAR | Status: DC | PRN
  Administered 2013-07-19: 2 mg via INTRAVENOUS

## 2013-07-19 MED ORDER — MIDAZOLAM HCL 2 MG/2ML IJ SOLN
INTRAMUSCULAR | Status: AC
  Filled 2013-07-19: qty 2

## 2013-07-19 MED ORDER — SODIUM CHLORIDE 0.9 % IV SOLN
INTRAVENOUS | Status: DC
  Administered 2013-07-19: 22:00:00 via INTRAVENOUS

## 2013-07-19 MED ORDER — HYDROMORPHONE HCL PF 1 MG/ML IJ SOLN
0.2500 mg | INTRAMUSCULAR | Status: DC | PRN
  Administered 2013-07-19 (×4): 0.5 mg via INTRAVENOUS

## 2013-07-19 MED ORDER — LIDOCAINE HCL (CARDIAC) 20 MG/ML IV SOLN
INTRAVENOUS | Status: AC
  Filled 2013-07-19: qty 5

## 2013-07-19 MED ORDER — CHLORHEXIDINE GLUCONATE 4 % EX LIQD: 60.0000 mL | Freq: Once | CUTANEOUS | Status: DC

## 2013-07-19 MED ORDER — DEXAMETHASONE SODIUM PHOSPHATE 10 MG/ML IJ SOLN
10.0000 mg | Freq: Once | INTRAMUSCULAR | Status: AC
  Administered 2013-07-19: 10 mg via INTRAVENOUS

## 2013-07-19 MED ORDER — ACETAMINOPHEN 500 MG PO TABS
1000.0000 mg | ORAL_TABLET | Freq: Four times a day (QID) | ORAL | Status: AC
  Administered 2013-07-20: 1000 mg via ORAL
  Filled 2013-07-19 (×2): qty 2

## 2013-07-19 MED ORDER — HYDROMORPHONE HCL PF 2 MG/ML IJ SOLN
INTRAMUSCULAR | Status: AC
  Filled 2013-07-19: qty 1

## 2013-07-19 MED ORDER — FENTANYL 50 MCG/HR TD PT72: 50.0000 ug | MEDICATED_PATCH | TRANSDERMAL | Status: DC

## 2013-07-19 MED ORDER — PROMETHAZINE HCL 25 MG/ML IJ SOLN: 6.2500 mg | INTRAMUSCULAR | Status: DC | PRN

## 2013-07-19 MED ORDER — MORPHINE SULFATE 10 MG/ML IJ SOLN: 2.0000 mg | INTRAMUSCULAR | Status: DC

## 2013-07-19 MED ORDER — CEFAZOLIN SODIUM-DEXTROSE 2-3 GM-% IV SOLR: 2.0000 g | INTRAVENOUS | Status: DC

## 2013-07-19 MED ORDER — ONDANSETRON HCL 4 MG/2ML IJ SOLN
4.0000 mg | Freq: Four times a day (QID) | INTRAMUSCULAR | Status: DC | PRN
  Administered 2013-07-20: 4 mg via INTRAVENOUS
  Filled 2013-07-19: qty 2

## 2013-07-19 MED ORDER — LIDOCAINE HCL (PF) 2 % IJ SOLN
INTRAMUSCULAR | Status: DC | PRN
  Administered 2013-07-19: 75 mg via INTRADERMAL

## 2013-07-19 MED ORDER — LORATADINE 10 MG PO TABS
10.0000 mg | ORAL_TABLET | Freq: Every day | ORAL | Status: DC
  Administered 2013-07-20 – 2013-07-21 (×2): 10 mg via ORAL
  Filled 2013-07-19 (×2): qty 1

## 2013-07-19 MED ORDER — SODIUM CHLORIDE 0.9 % IJ SOLN
INTRAMUSCULAR | Status: AC
  Filled 2013-07-19: qty 10

## 2013-07-19 MED ORDER — MORPHINE SULFATE 10 MG/ML IJ SOLN
2.0000 mg | INTRAMUSCULAR | Status: DC | PRN
  Administered 2013-07-19 (×2): 2 mg via INTRAVENOUS

## 2013-07-19 MED ORDER — MENTHOL 3 MG MT LOZG
1.0000 | LOZENGE | OROMUCOSAL | Status: DC | PRN
  Filled 2013-07-19: qty 9

## 2013-07-19 MED ORDER — SODIUM CHLORIDE 0.9 % IJ SOLN
INTRAMUSCULAR | Status: DC | PRN
  Administered 2013-07-19: 30 mL via INTRAVENOUS

## 2013-07-19 MED ORDER — ROCURONIUM BROMIDE 100 MG/10ML IV SOLN
INTRAVENOUS | Status: DC | PRN
  Administered 2013-07-19: 10 mg via INTRAVENOUS
  Administered 2013-07-19: 40 mg via INTRAVENOUS

## 2013-07-19 MED ORDER — HYDROMORPHONE HCL PF 1 MG/ML IJ SOLN
INTRAMUSCULAR | Status: AC
  Filled 2013-07-19: qty 1

## 2013-07-19 MED ORDER — BUPIVACAINE HCL (PF) 0.25 % IJ SOLN
INTRAMUSCULAR | Status: DC | PRN
  Administered 2013-07-19: 20 mL

## 2013-07-19 MED ORDER — BUPIVACAINE LIPOSOME 1.3 % IJ SUSP
20.0000 mL | Freq: Once | INTRAMUSCULAR | Status: DC
  Filled 2013-07-19: qty 20

## 2013-07-19 MED ORDER — FENTANYL CITRATE 0.05 MG/ML IJ SOLN
INTRAMUSCULAR | Status: AC
  Administered 2013-07-19: 25 ug
  Filled 2013-07-19: qty 2

## 2013-07-19 MED ORDER — LABETALOL HCL 5 MG/ML IV SOLN
INTRAVENOUS | Status: DC | PRN
  Administered 2013-07-19: 2.5 mg via INTRAVENOUS

## 2013-07-19 MED ORDER — ONDANSETRON HCL 4 MG PO TABS
4.0000 mg | ORAL_TABLET | Freq: Four times a day (QID) | ORAL | Status: DC | PRN
  Administered 2013-07-20 – 2013-07-21 (×3): 4 mg via ORAL
  Filled 2013-07-19 (×4): qty 1

## 2013-07-19 MED ORDER — FENTANYL CITRATE 0.05 MG/ML IJ SOLN
INTRAMUSCULAR | Status: AC
  Filled 2013-07-19: qty 5

## 2013-07-19 MED ORDER — MORPHINE SULFATE 10 MG/ML IJ SOLN
INTRAMUSCULAR | Status: AC
  Filled 2013-07-19: qty 1

## 2013-07-19 MED ORDER — GLYCOPYRROLATE 0.2 MG/ML IJ SOLN
INTRAMUSCULAR | Status: AC
  Filled 2013-07-19: qty 3

## 2013-07-19 MED ORDER — ACETAMINOPHEN 10 MG/ML IV SOLN
1000.0000 mg | Freq: Once | INTRAVENOUS | Status: AC
  Administered 2013-07-19: 1000 mg via INTRAVENOUS
  Filled 2013-07-19: qty 100

## 2013-07-19 MED ORDER — LACTATED RINGERS IV SOLN
INTRAVENOUS | Status: DC
  Administered 2013-07-19 (×2): via INTRAVENOUS
  Administered 2013-07-19: 1000 mL via INTRAVENOUS

## 2013-07-19 SURGICAL SUPPLY — 41 items
BAG ZIPLOCK 12X15 (MISCELLANEOUS) IMPLANT
BLADE SAW SGTL 18X1.27X75 (BLADE) ×2 IMPLANT
BLADE SAW SGTL 18X1.27X75MM (BLADE) ×1
CAPT HIP PF MOP ×3 IMPLANT
CLOSURE WOUND 1/2 X4 (GAUZE/BANDAGES/DRESSINGS) ×1
DECANTER SPIKE VIAL GLASS SM (MISCELLANEOUS) ×3 IMPLANT
DRAPE C-ARM 42X120 X-RAY (DRAPES) ×3 IMPLANT
DRAPE STERI IOBAN 125X83 (DRAPES) ×3 IMPLANT
DRAPE U-SHAPE 47X51 STRL (DRAPES) ×9 IMPLANT
DRSG ADAPTIC 3X8 NADH LF (GAUZE/BANDAGES/DRESSINGS) ×3 IMPLANT
DRSG MEPILEX BORDER 4X4 (GAUZE/BANDAGES/DRESSINGS) ×3 IMPLANT
DRSG MEPILEX BORDER 4X8 (GAUZE/BANDAGES/DRESSINGS) ×3 IMPLANT
DURAPREP 26ML APPLICATOR (WOUND CARE) ×3 IMPLANT
ELECT BLADE 6.5 EXT (BLADE) ×3 IMPLANT
ELECT REM PT RETURN 9FT ADLT (ELECTROSURGICAL) ×3
ELECTRODE REM PT RTRN 9FT ADLT (ELECTROSURGICAL) ×1 IMPLANT
EVACUATOR 1/8 PVC DRAIN (DRAIN) ×3 IMPLANT
FACESHIELD LNG OPTICON STERILE (SAFETY) ×12 IMPLANT
GLOVE BIO SURGEON STRL SZ7.5 (GLOVE) ×3 IMPLANT
GLOVE BIO SURGEON STRL SZ8 (GLOVE) ×6 IMPLANT
GLOVE BIOGEL PI IND STRL 8 (GLOVE) ×2 IMPLANT
GLOVE BIOGEL PI INDICATOR 8 (GLOVE) ×4
GOWN STRL REUS W/TWL LRG LVL3 (GOWN DISPOSABLE) ×3 IMPLANT
GOWN STRL REUS W/TWL XL LVL3 (GOWN DISPOSABLE) ×3 IMPLANT
KIT BASIN OR (CUSTOM PROCEDURE TRAY) ×3 IMPLANT
NDL SAFETY ECLIPSE 18X1.5 (NEEDLE) ×2 IMPLANT
NEEDLE HYPO 18GX1.5 SHARP (NEEDLE) ×4
PACK TOTAL JOINT (CUSTOM PROCEDURE TRAY) ×3 IMPLANT
PADDING CAST COTTON 6X4 STRL (CAST SUPPLIES) ×3 IMPLANT
SPONGE GAUZE 4X4 12PLY (GAUZE/BANDAGES/DRESSINGS) IMPLANT
STRIP CLOSURE SKIN 1/2X4 (GAUZE/BANDAGES/DRESSINGS) ×2 IMPLANT
SUCTION FRAZIER 12FR DISP (SUCTIONS) IMPLANT
SUT ETHIBOND NAB CT1 #1 30IN (SUTURE) ×3 IMPLANT
SUT MNCRL AB 4-0 PS2 18 (SUTURE) ×3 IMPLANT
SUT VIC AB 2-0 CT1 27 (SUTURE) ×4
SUT VIC AB 2-0 CT1 TAPERPNT 27 (SUTURE) ×2 IMPLANT
SUT VLOC 180 0 24IN GS25 (SUTURE) ×3 IMPLANT
SYR 20CC LL (SYRINGE) ×3 IMPLANT
SYR 50ML LL SCALE MARK (SYRINGE) ×3 IMPLANT
TOWEL OR 17X26 10 PK STRL BLUE (TOWEL DISPOSABLE) ×3 IMPLANT
TRAY FOLEY CATH 14FRSI W/METER (CATHETERS) ×3 IMPLANT

## 2013-07-19 NOTE — H&P (View-Only) (Signed)
Erin [name]  DOB: [birth year] Single / Language: [place] / Race: White Female  Date of Admission:  07-19-2013  Chief Complaint:  Severe Left Hip Pain  History of Present Illness The patient is a 73 year old female who comes in for a preoperative History and Physical. The patient is scheduled for a left total hip arthroplasty (anterior approach) to be performed by Dr. Gus RankinFrank V. Aluisio, [facility] on 07-19-2013. The patient is a 73 year old female who presents today for follow up of their hip. The patient is being followed for their left hip pain and osteoarthritis. They are several week(s) out from intra-articular injection with Dr. Ethelene [name]. Symptoms reported today include: pain and difficulty ambulating. The patient feels that they are doing poorly and report their pain level to be severe. Current treatment includes: relative rest and pain medications. The following medication has been used for pain control: Tylenol with codeine. The patient has not gotten any relief of their symptoms with corticosteroid use or Cortisone injections. Erin [name] comes in today for a recheck of her left hip pain. 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 She has been on a walker. She's also seen by Dr. Ethelene [name] for her back. She was scheduled to have an MRI but, due to the significant pain in her hip, she was unable to complete that. She has been on a Dosepak. This provided no improvement with her left hip pain. She's had no injury since we saw her the beginning of December but reports that "something is different" about the hip. She realizes she has a bad hip but wanted to come in and get it checked due to the significant increase in discomfort that she's having. Most of her pain is related in her groin and radiating down the anterior thigh. She is finding it very difficult to weight bear. her pain has progressed tot hte point where she is  now essentially wheelchair bound to get around. Family has come in to help her at home and to be with her following her surgery. She is ready to get the left hip replaced now. They have been treated conservatively in the past for the above stated problem and despite conservative measures, they continue to have progressive pain and severe functional limitations and dysfunction. They have failed non-operative management including home exercise, medications, and injections. It is felt that they would benefit from undergoing total joint replacement. Risks and benefits of the procedure have been discussed with the patient and they elect to proceed with surgery. There are no active contraindications to surgery such as ongoing infection or rapidly progressive neurological disease.  Allergies Penicillins. 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 Doing very well with regards to the right hip surgery. 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 Mother. Osteoarthritis. Brother.  Social History Children. 2 Current drinker. 04/08/2013: Currently drinks beer less than 5 times per week Current work status. retired - Audiological scientistN Exercise. Exercises weekly; does gym / weights Living situation. live alone Marital status. divorced Number of flights of stairs before winded. greater than 5 Tobacco use. Former smoker. 04/08/2013: smoke(d) 3/4 pack(s) per day Not under pain contract No history of drug/alcohol rehab Tobacco / smoke exposure. 04/08/2013: no Post-Surgical Plans. Plan for home with family  Medication History Lisinopril (40MG   Tablet, 1 Oral daily) Active. Aspirin (81MG  Tablet, 1 Oral daily) Active. Allegra Allergy (180MG  Tablet, 1 Oral daily) Active.  Past Surgical  History Total Hip Replacement. right Cataract Surgery. Date: 2013. bilateral  Review of Systems General:Not Present- Chills, Fever, Night Sweats, Fatigue, Weight Gain, Weight Loss and Memory Loss. Skin:Not Present- Hives, Itching, Rash, Eczema and Lesions. HEENT:Not Present- Tinnitus, Headache, Double Vision, Visual Loss, Hearing Loss and Dentures. Respiratory:Not Present- Shortness of breath with exertion, Shortness of breath at rest, Allergies, Coughing up blood and Chronic Cough. Cardiovascular:Not Present- Chest Pain, Racing/skipping heartbeats, Difficulty Breathing Lying Down, Murmur, Swelling and Palpitations. Gastrointestinal:Not Present- Bloody Stool, Heartburn, Abdominal Pain, Vomiting, Nausea, Constipation, Diarrhea, Difficulty Swallowing, Jaundice and Loss of appetitie. Female Genitourinary:Not Present- Blood in Urine, Urinary frequency, Weak urinary stream, Discharge, Flank Pain, Incontinence, Painful Urination, Urgency, Urinary Retention and Urinating at Night. Musculoskeletal:Present- Joint Pain (left hip). Not Present- Muscle Weakness, Muscle Pain, Joint Swelling, Back Pain, Morning Stiffness and Spasms. Neurological:Not Present- Tremor, Dizziness, Blackout spells, Paralysis, Difficulty with balance and Weakness. Psychiatric:Not Present- Insomnia.   Vitals Pulse: 86 (Regular) Resp.: 12 (Unlabored) BP: 126/64 (Sitting, Left Arm, Standard)  Physical Exam The physical exam findings are as follows:   General Mental Status - Alert, cooperative and good historian. General Appearance- pleasant. Not in acute distress. Orientation- Oriented X3. Build & Nutrition- Petite, Well nourished and Well developed. Gait- Use of assistive device (essemtially wheelchair bound at this time due to the progression of her hip).   Head and Neck Head- normocephalic, atraumatic . Neck Global Assessment- supple. no bruit auscultated on the right and no bruit  auscultated on the left.   Eye Vision- Wears corrective lenses. Pupil- Bilateral- Regular and Round. Motion- Bilateral- EOMI.   Chest and Lung Exam Auscultation: Breath sounds:- clear at anterior chest wall and - clear at posterior chest wall. Adventitious sounds:- No Adventitious sounds.   Cardiovascular Auscultation:Rhythm- Regular rate and rhythm (with the exception of occassional extra or ectopic beat noted on exam). Heart Sounds- S1 WNL and S2 WNL. Murmurs & Other Heart Sounds:Auscultation of the heart reveals - No Murmurs.   Abdomen Palpation/Percussion:Tenderness- Abdomen is non-tender to palpation. Rigidity (guarding)- Abdomen is soft. Auscultation:Auscultation of the abdomen reveals - Bowel sounds normal.   Female Genitourinary Not done, not pertinent to present illness  Musculoskeletal  On physical exam, she has significant discomfort with passive and active ROM. Due to the significant discomfort, ROM was not measured. She is able to fully extend the knee without difficulty. No effusion or instability about the knee. Calves are soft and nontender. Distal pulses are 2+. She does have normal painless ROM in the right hip. No specific tenderness to palpation over the greater trochanteric bursa on either side.  Radiographs - she's had extreme progression of this disease to the point where she has essentially completely necrosed the femoral head on that left side. No fractures noted.   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  Note: Plan is for a Left Total Hip Replacement - Anterior Approach by Dr. Lequita Halt.  Plan is to go ome with family  PCP - Dr. Gavin Potters  The patient does not have any contraindications and will receive TXA (tranexamic acid) prior to surgery.  Signed electronically by Lauraine Rinne, III PA-C

## 2013-07-19 NOTE — Op Note (Signed)
OPERATIVE REPORT  PREOPERATIVE DIAGNOSIS: Osteoarthritis of the Left hip.   POSTOPERATIVE DIAGNOSIS: Osteoarthritis of the Left  hip.   PROCEDURE: Left total hip arthroplasty, anterior approach.   SURGEON: Ollen GrossFrank Daelynn Blower, MD   ASSISTANT: Avel Peacerew Perkins, PA-C  ANESTHESIA:  General  ESTIMATED BLOOD LOSS:- 600 ml  DRAINS: Hemovac x1.   COMPLICATIONS: None   CONDITION: PACU - hemodynamically stable.   BRIEF CLINICAL NOTE: Erin [name] is a 73 y.o. female who has advanced end-  stage arthritis of his Left  hip with progressively worsening pain and  Dysfunction. She has rapidly progressive OA and has essentially destroyed nearly her entire femoral head in a six month time span.The patient has failed nonoperative management and presents for  total hip arthroplasty.   PROCEDURE IN DETAIL: After successful administration of spinal  anesthetic, the traction boots for the [facility] bed were placed on both  feet and the patient was [facility] bed, boots placed into the leg  holders. The Left hip was then isolated from the perineum with plastic  drapes and prepped and draped in the usual sterile fashion. ASIS and  greater trochanter were marked and a oblique incision was made, starting  at about 1 cm lateral and 2 cm distal to the ASIS and coursing towards  the anterior cortex of the femur. The skin was cut with a 10 blade  through subcutaneous tissue to the level of the fascia overlying the  tensor fascia lata muscle. The fascia was then incised in line with the  incision at the junction of the anterior third and posterior 2/3rd. The  muscle was teased off the fascia and then the interval between the TFL  and the rectus was developed. The Hohmann retractor was then placed at  the top of the femoral neck over the capsule. The vessels overlying the  capsule were cauterized and the fat on top of the capsule was removed.  A Hohmann retractor was then placed anterior  underneath the rectus  femoris to give exposure to the entire anterior capsule. A T-shaped  capsulotomy was performed. The edges were tagged and the femoral head  was identified.       Osteophytes are removed off the superior acetabulum.  The femoral neck was then cut in situ with an oscillating saw. Traction  was then applied to the left lower extremity utilizing the [facility]  traction. The femoral head was then removed. Retractors were placed  around the acetabulum and then circumferential removal of the labrum was  performed. Osteophytes were also removed. Reaming starts at 43 mm to  medialize and  Increased in 2 mm increments to 49 mm. We reamed in  approximately 40 degrees of abduction, 20 degrees anteversion. A 50 mm  pinnacle acetabular shell was then impacted in anatomic position under  fluoroscopic guidance with excellent purchase. We did not need to place  any additional dome screws. A 32 mm neutral + 4 marathon liner was then  placed into the acetabular shell.       The femoral lift was then placed along the lateral aspect of the femur  just distal to the vastus ridge. The leg was  externally rotated and capsule  was stripped off the inferior aspect of the femoral neck down to the  level of the lesser trochanter, this was done with electrocautery. The femur was lifted after this was performed. The  leg was then placed and extended in  adducted position to essentially delivering the femur. We also removed the capsule superiorly and the  piriformis from the piriformis fossa to gain excellent exposure of the  proximal femur. Rongeur was used to remove some cancellous bone to get  into the lateral portion of the proximal femur for placement of the  initial starter reamer. The starter broaches was placed  the starter broach  and was shown to go down the center of the canal. Broaching  with the  Corail system was then performed starting at size 8, coursing  Up to size 12. A size 12 had  excellent torsional and rotational  and axial stability. The trial standard offset neck was then placed  with a 32 + 1 trial head. The hip was then reduced. We confirmed that  the stem was in the canal both on AP and lateral x-rays. It also has excellent sizing. The hip was reduced with outstanding stability through full extension, full external rotation,  and then flexion in adduction internal rotation. AP pelvis was taken  and the leg lengths were measured and found to be exactly equal. Hip  was then dislocated again and the femoral head and neck removed. The  femoral broach was removed. Size 12 Corail stem with a standard offset  neck was then impacted into the femur following native anteversion. Has  excellent purchase in the canal. Excellent torsional and rotational and  axial stability. It is confirmed to be in the canal on AP and lateral  fluoroscopic views. The 32 + 1  metal head was placed and the hip  reduced with outstanding stability. Again AP pelvis was taken and it  confirmed that the leg lengths were equal. The wound was then copiously  irrigated with saline solution and the capsule reattached and repaired  with Ethibond suture.  20 mL of Exparel mixed with 50 mL of saline then additional 20 ml of .25% Bupivicaine injected into the capsule and into the edge of the tensor fascia lata as well as subcutaneous tissue. The fascia overlying the tensor fascia lata was  then closed with a running #1 V-Loc. Subcu was closed with interrupted  2-0 Vicryl and subcuticular running 4-0 Monocryl. Incision was cleaned  and dried. Steri-Strips and a bulky sterile dressing applied. Hemovac  drain was hooked to suction and then he was awakened and transported to  recovery in stable condition.        Please note that a surgical assistant was a medical necessity for this procedure to perform it in a safe and expeditious manner. Assistant was necessary to provide appropriate retraction of vital  neurovascular structures and to prevent femoral fracture and allow for anatomic placement of the prosthesis.  Ollen Gross, M.D.

## 2013-07-19 NOTE — Anesthesia Preprocedure Evaluation (Addendum)
Anesthesia Evaluation  Patient identified by MRN, date of birth, ID band Patient awake    Reviewed: Allergy & Precautions, H&P , NPO status , Patient's Chart, lab work & pertinent test results  Airway Mallampati: II TM Distance: >3 FB Neck ROM: Full    Dental no notable dental hx.    Pulmonary neg pulmonary ROS, former smoker,  breath sounds clear to auscultation  Pulmonary exam normal       Cardiovascular hypertension, Pt. on medications Rhythm:Regular Rate:Normal     Neuro/Psych negative neurological ROS  negative psych ROS   GI/Hepatic negative GI ROS, Neg liver ROS,   Endo/Other  negative endocrine ROS  Renal/GU negative Renal ROS  negative genitourinary   Musculoskeletal negative musculoskeletal ROS (+)   Abdominal   Peds negative pediatric ROS (+)  Hematology negative hematology ROS (+)   Anesthesia Other Findings   Reproductive/Obstetrics negative OB ROS                          Anesthesia Physical Anesthesia Plan  ASA: II  Anesthesia Plan: General   Post-op Pain Management:    Induction: Intravenous  Airway Management Planned: Oral ETT  Additional Equipment:   Intra-op Plan:   Post-operative Plan: Extubation in OR  Informed Consent: I have reviewed the patients History and Physical, chart, labs and discussed the procedure including the risks, benefits and alternatives for the proposed anesthesia with the patient or authorized representative who has indicated his/her understanding and acceptance.   Dental advisory given  Plan Discussed with: CRNA and Surgeon  Anesthesia Plan Comments:         Anesthesia Quick Evaluation  

## 2013-07-19 NOTE — Anesthesia Postprocedure Evaluation (Signed)
  Anesthesia Post-op Note  Patient: Erin [name]  Procedure(s) Performed: Procedure(s) (LRB): LEFT TOTAL HIP ARTHROPLASTY ANTERIOR APPROACH (Left)  Patient Location: PACU  Anesthesia Type: General  Level of Consciousness: awake and alert   Airway and Oxygen Therapy: Patient Spontanous Breathing  Post-op Pain: mild  Post-op Assessment: Post-op Vital signs reviewed, Patient's Cardiovascular Status Stable, Respiratory Function Stable, Patent Airway and No signs of Nausea or vomiting  Last Vitals:  Filed Vitals:   07/19/13 1850  BP:   Pulse:   Temp: 36.7 C  Resp:     Post-op Vital Signs: stable   Complications: No apparent anesthesia complications

## 2013-07-19 NOTE — Interval H&P Note (Signed)
History and Physical Interval Note:  07/19/2013 4:18 PM  Erin [name]  has presented today for surgery, with the diagnosis of OA LEFT HIP  The various methods of treatment have been discussed with the patient and family. After consideration of risks, benefits and other options for treatment, the patient has consented to  Procedure(s): LEFT TOTAL HIP ARTHROPLASTY ANTERIOR APPROACH (Left) as a surgical intervention .  The patient's history has been reviewed, patient examined, no change in status, stable for surgery.  I have reviewed the patient's chart and labs.  Questions were answered to the patient's satisfaction.     Loanne DrillingALUISIO,Maureena Dabbs V

## 2013-07-19 NOTE — Transfer of Care (Signed)
Immediate Anesthesia Transfer of Care Note  Patient: Erin [name]  Procedure(s) Performed: Procedure(s): LEFT TOTAL HIP ARTHROPLASTY ANTERIOR APPROACH (Left)  Patient Location: PACU  Anesthesia Type:General  Level of Consciousness: awake, alert  and oriented  Airway & Oxygen Therapy: Patient Spontanous Breathing and Patient connected to face mask oxygen  Post-op Assessment: Report given to PACU RN and Post -op Vital signs reviewed and stable  Post vital signs: Reviewed and stable  Complications: No apparent anesthesia complications

## 2013-07-20 ENCOUNTER — Encounter (HOSPITAL_COMMUNITY): Payer: Self-pay | Admitting: Orthopedic Surgery

## 2013-07-20 DIAGNOSIS — D62 Acute posthemorrhagic anemia: Secondary | ICD-10-CM | POA: Diagnosis not present

## 2013-07-20 DIAGNOSIS — E871 Hypo-osmolality and hyponatremia: Secondary | ICD-10-CM | POA: Diagnosis present

## 2013-07-20 LAB — CBC
HCT: 27.2 % — ABNORMAL LOW (ref 36.0–46.0)
Hemoglobin: 9.2 g/dL — ABNORMAL LOW (ref 12.0–15.0)
MCH: 31.4 pg (ref 26.0–34.0)
MCHC: 33.8 g/dL (ref 30.0–36.0)
MCV: 92.8 fL (ref 78.0–100.0)
PLATELETS: 230 10*3/uL (ref 150–400)
RBC: 2.93 MIL/uL — ABNORMAL LOW (ref 3.87–5.11)
RDW: 12.6 % (ref 11.5–15.5)
WBC: 8.1 10*3/uL (ref 4.0–10.5)

## 2013-07-20 LAB — BASIC METABOLIC PANEL
BUN: 10 mg/dL (ref 6–23)
CALCIUM: 8.6 mg/dL (ref 8.4–10.5)
CO2: 22 meq/L (ref 19–32)
Chloride: 94 mEq/L — ABNORMAL LOW (ref 96–112)
Creatinine, Ser: 0.62 mg/dL (ref 0.50–1.10)
GFR calc Af Amer: >90 mL/min (ref >90)
GFR, EST NON AFRICAN AMERICAN: 87 mL/min — AB (ref >90)
Glucose, Bld: 163 mg/dL — ABNORMAL HIGH (ref 70–99)
Potassium: 4.8 mEq/L (ref 3.7–5.3)
Sodium: 130 mEq/L — ABNORMAL LOW (ref 137–147)

## 2013-07-20 MED ORDER — LISINOPRIL 40 MG PO TABS
40.0000 mg | ORAL_TABLET | Freq: Every day | ORAL | Status: DC
  Administered 2013-07-20 – 2013-07-21 (×2): 40 mg via ORAL
  Filled 2013-07-20 (×2): qty 1

## 2013-07-20 NOTE — Evaluation (Signed)
Physical Therapy Evaluation Patient Details Name: Erin [name] MRN: [id number] DOB: [birth year] Today's Date: 07/20/2013 Time: 0815-0850 PT Time Calculation (min): 35 min  PT Assessment / Plan / Recommendation History of Present Illness  Pt was admitted for L DA THA  Clinical Impression  Pt s/p L THR presents with decreased L LE strength/ROM and post op pain limiting functional mobility.  Pt should progress well to d/c home with assist of family/friends and HHPT follow up.    PT Assessment  Patient needs continued PT services    Follow Up Recommendations  Home health PT    Does the patient have the potential to tolerate intense rehabilitation      Barriers to Discharge        Equipment Recommendations  Rolling walker with 5" wheels (pt is 5'0")    Recommendations for Other Services OT consult   Frequency 7X/week    Precautions / Restrictions Precautions Precautions: Fall Restrictions Weight Bearing Restrictions: No Other Position/Activity Restrictions: WBAT   Pertinent Vitals/Pain 5/10; premed. Ice pack provided      Mobility  Bed Mobility Overal bed mobility: Needs Assistance Bed Mobility: Supine to Sit Supine to sit: Min assist General bed mobility comments: cues for sequence and use of L LE to self assist Transfers Overall transfer level: Needs assistance Equipment used: Rolling walker (2 wheeled) Transfers: Sit to/from Stand Sit to Stand: Min guard General transfer comment: from recliner; cues to extend LLE Ambulation/Gait Ambulation/Gait assistance: Min assist Ambulation Distance (Feet): 52 Feet Assistive device: Rolling walker (2 wheeled) Gait Pattern/deviations: Step-to pattern;Decreased step length - right;Decreased step length - left;Shuffle;Trunk flexed Gait velocity: decr General Gait Details: cues for sequence, posture, position from RW    Exercises Total Joint Exercises Ankle Circles/Pumps: AROM;Both;10 reps;Supine Quad Sets: AROM;Both;10  reps;Supine Heel Slides: AAROM;15 reps;Supine;Left Hip ABduction/ADduction: AAROM;Left;15 reps;Supine   PT Diagnosis: Difficulty walking  PT Problem List: Decreased strength;Decreased range of motion;Decreased activity tolerance;Decreased mobility;Decreased knowledge of use of DME;Pain PT Treatment Interventions: DME instruction;Gait training;Stair training;Functional mobility training;Therapeutic activities;Therapeutic exercise;Patient/family education     PT Goals(Current goals can be found in the care plan section) Acute Rehab PT Goals Patient Stated Goal: home PT Goal Formulation: With patient Time For Goal Achievement: 07/27/13 Potential to Achieve Goals: Good  Visit Information  Last PT Received On: 07/20/13 Assistance Needed: +1 History of Present Illness: Pt was admitted for L DA THA       Prior Functioning  Home Living Family/patient expects to be discharged to:: Private residence Living Arrangements: Alone;Other relatives Available Help at Discharge: Family;Friend(s);Available 24 hours/day Type of Home: House Home Access: Stairs to enter Entergy CorporationEntrance Stairs-Number of Steps: 3 Entrance Stairs-Rails: None Home Layout: One level Home Equipment: Bedside commode;Shower seat Additional Comments: Has assist through this weekend Prior Function Level of Independence: Independent with assistive device(s) Comments: has used WC last several weeks 2* hip pain Communication Communication: No difficulties Dominant Hand: Right    Cognition  Cognition Arousal/Alertness: Awake/alert Behavior During Therapy: WFL for tasks assessed/performed Overall Cognitive Status: Within Functional Limits for tasks assessed    Extremity/Trunk Assessment Upper Extremity Assessment Upper Extremity Assessment: Overall WFL for tasks assessed Lower Extremity Assessment Lower Extremity Assessment: LLE deficits/detail LLE Deficits / Details: Hip strength 2+/5 with AAROM at hip to 95 flex and 15 abd    Balance    End of Session PT - End of Session Equipment Utilized During Treatment: Gait belt Activity Tolerance: Patient tolerated treatment well Patient left: in chair;with call bell/phone within reach  Nurse Communication: Mobility status  GP     Kemond Amorin 07/20/2013, 11:05 AM

## 2013-07-20 NOTE — Evaluation (Signed)
Occupational Therapy Evaluation Patient Details Name: Erin [name] MRN: [id number] DOB: [birth year] Today's Date: 07/20/2013 Time: 4098-1191 OT Time Calculation (min): 28 min  OT Assessment / Plan / Recommendation History of present illness Pt was admitted for L DA THA   Clinical Impression   Pt was admitted for the above surgery. She will benefit from skilled OT to increase safety and independence with adls:  Goals are set for supervision/set up.      OT Assessment  Patient needs continued OT Services    Follow Up Recommendations  No OT follow up    Barriers to Discharge      Equipment Recommendations  None recommended by OT    Recommendations for Other Services    Frequency  Min 2X/week    Precautions / Restrictions Precautions Precautions: Fall Restrictions Weight Bearing Restrictions: No   Pertinent Vitals/Pain No c/o pain:  Soreness only L hip. Reapplied ice    ADL  Grooming: Wash/dry hands;Wash/dry face;Teeth care;Supervision/safety Where Assessed - Grooming: Supported standing Upper Body Bathing: Set up Where Assessed - Upper Body Bathing: Unsupported sitting Lower Body Bathing: Minimal assistance Where Assessed - Lower Body Bathing: Supported sit to stand Upper Body Dressing: Set up Where Assessed - Upper Body Dressing: Unsupported sitting Lower Body Dressing: Moderate assistance Where Assessed - Lower Body Dressing: Supported sit to Pharmacist, hospital: Minimal Dentist Method: Sit to Barista: Materials engineer and Hygiene: Min guard Where Assessed - Engineer, mining and Hygiene: Sit to stand from 3-in-1 or toilet Equipment Used: Rolling walker;Reacher Transfers/Ambulation Related to ADLs: ambulated 3 feet to sink; spt to bsc; cues to side step through tight spaces for safety ADL Comments: pt has a Sports administrator and she will have help with socks from friends; reviewed  uses of reacher for adls   OT Diagnosis: Generalized weakness  OT Problem List: Decreased strength;Decreased activity tolerance;Decreased knowledge of use of DME or AE;Decreased safety awareness OT Treatment Interventions: Self-care/ADL training;DME and/or AE instruction;Patient/family education   OT Goals(Current goals can be found in the care plan section) Acute Rehab OT Goals Patient Stated Goal: home OT Goal Formulation: With patient Time For Goal Achievement: 07/27/13 Potential to Achieve Goals: Good ADL Goals Pt Will Transfer to Toilet: with supervision;ambulating;bedside commode Pt Will Perform Tub/Shower Transfer: with supervision;3 in 1;Shower transfer Additional ADL Goal #1: pt will completed LB adls (x socks) with reacher with set up Additional ADL Goal #2: pt will not need any safety cues for bathroom transfers  Visit Information  Last OT Received On: 07/20/13 Assistance Needed: +1 History of Present Illness: Pt was admitted for L DA THA       Prior Functioning     Home Living Family/patient expects to be discharged to:: Private residence Living Arrangements: Alone;Other relatives Home Equipment: Bedside commode;Shower seat Prior Function Level of Independence: Independent with assistive device(s) Communication Communication: No difficulties Dominant Hand: Right         Vision/Perception     Cognition  Cognition Arousal/Alertness: Awake/alert Behavior During Therapy: WFL for tasks assessed/performed Overall Cognitive Status: Within Functional Limits for tasks assessed    Extremity/Trunk Assessment Upper Extremity Assessment Upper Extremity Assessment: Overall WFL for tasks assessed     Mobility Transfers Overall transfer level: Needs assistance Equipment used: Rolling walker (2 wheeled) Transfers: Sit to/from Stand Sit to Stand: Min guard General transfer comment: from recliner; cues to extend LLE     Exercise     Balance  End of  Session OT - End of Session Activity Tolerance: Patient tolerated treatment well Patient left: in chair;with call bell/phone within reach;with nursing/sitter in room  GO     [facility]ER,Kyshaun Barnette 07/20/2013, 9:38 AM Marica OtterMaryellen Vyctoria Dickman, OTR/L [phone] 07/20/2013

## 2013-07-20 NOTE — Progress Notes (Signed)
Utilization review completed.  

## 2013-07-20 NOTE — Progress Notes (Signed)
[facility] the [facility]: Dan HumphreysWalker  If patient discharges after hours, please call [phone].   Renard HamperLecretia Williamson 07/20/2013, 12:44 PM

## 2013-07-20 NOTE — Progress Notes (Signed)
Physical Therapy Treatment Patient Details Name: Erin [name] MRN: [id number] DOB: 1941/01/14 Today's Date: 07/20/2013 Time: [id number] PT Time Calculation (min): 20 min  PT Assessment / Plan / Recommendation  History of Present Illness Pt was admitted for L DA THA   PT Comments   Pt progressing extremely well and should be ready for d/c in am from PT perspective  Follow Up Recommendations  Home health PT     Does the patient have the potential to tolerate intense rehabilitation     Barriers to Discharge        Equipment Recommendations  Rolling walker with 5" wheels    Recommendations for Other Services OT consult  Frequency 7X/week   Progress towards PT Goals Progress towards PT goals: Progressing toward goals  Plan Current plan remains appropriate    Precautions / Restrictions Precautions Precautions: Fall Restrictions Weight Bearing Restrictions: No Other Position/Activity Restrictions: WBAT   Pertinent Vitals/Pain 4/10; MEDS requested    Mobility  Transfers Overall transfer level: Needs assistance Equipment used: Rolling walker (2 wheeled) Transfers: Sit to/from Stand Sit to Stand: Supervision General transfer comment: cues for LE management and use of UEs to self assist Ambulation/Gait Ambulation/Gait assistance: Min guard Ambulation Distance (Feet): 250 Feet (twice) Assistive device: Rolling walker (2 wheeled) Gait Pattern/deviations: Step-to pattern;Step-through pattern;Shuffle;Trunk flexed Gait velocity: decr General Gait Details: cues for posture and position from RW    Exercises     PT Diagnosis:    PT Problem List:   PT Treatment Interventions:     PT Goals (current goals can now be found in the care plan section) Acute Rehab PT Goals Patient Stated Goal: home PT Goal Formulation: With patient Time For Goal Achievement: 07/27/13 Potential to Achieve Goals: Good  Visit Information  Last PT Received On: 07/20/13 Assistance Needed:  +1 History of Present Illness: Pt was admitted for L DA THA    Subjective Data  Patient Stated Goal: home   Cognition  Cognition Arousal/Alertness: Awake/alert Behavior During Therapy: WFL for tasks assessed/performed Overall Cognitive Status: Within Functional Limits for tasks assessed    Balance     End of Session PT - End of Session Equipment Utilized During Treatment: Gait belt Activity Tolerance: Patient tolerated treatment well Patient left: in chair;with call bell/phone within reach Nurse Communication: Mobility status   GP     Prentice Sackrider 07/20/2013, 3:47 PM

## 2013-07-20 NOTE — Progress Notes (Signed)
   Subjective: 1 Day Post-Op Procedure(s) (LRB): LEFT TOTAL HIP ARTHROPLASTY ANTERIOR APPROACH (Left) Patient reports pain as mild.   Patient seen in rounds with Dr. Lequita HaltAluisio.  She is doing fairly;y well this morning.  Pain is controlled. Patient is well, and has had no acute complaints or problems We will start therapy today.  Plan is to go Home after hospital stay.  Objective: Vital signs in last 24 hours: Temp:  [97.9 F (36.6 C)-98.4 F (36.9 C)] 98.1 F (36.7 C) (02/19 0628) Pulse Rate:  [84-110] 110 (02/19 0628) Resp:  [9-18] 16 (02/19 0628) BP: (95-175)/(56-99) 150/77 mmHg (02/19 0628) SpO2:  [99 %-100 %] 100 % (02/19 0628) Weight:  [50.349 kg (111 lb)] 50.349 kg (111 lb) (02/18 2024)  Intake/Output from previous day:  Intake/Output Summary (Last 24 hours) at 07/20/13 0807 Last data filed at 07/20/13 [id number]  Gross per 24 hour  Intake   3525 ml  Output   1505 ml  Net   2020 ml    Intake/Output this shift:    Labs:  Recent Labs  07/20/13 0435  HGB 9.2*    Recent Labs  07/20/13 0435  WBC 8.1  RBC 2.93*  HCT 27.2*  PLT 230    Recent Labs  07/20/13 0435  NA 130*  K 4.8  CL 94*  CO2 22  BUN 10  CREATININE 0.62  GLUCOSE 163*  CALCIUM 8.6   No results found for this basename: LABPT, INR,  in the last 72 hours  EXAM General - Patient is Alert, Appropriate and Oriented Extremity - Neurovascular intact Sensation intact distally Dorsiflexion/Plantar flexion intact Dressing - dressing C/D/I Motor Function - intact, moving foot and toes well on exam.  Hemovac pulled without difficulty.  Past Medical History  Diagnosis Date  . Hyponatremia 07-13-13    hx. of   . GERD (gastroesophageal reflux disease)   . Arthritis 07-13-13    osteoarthritis  . Abnormal EKG 07-13-13    hx. of -[place]. Juliann [place], [place] last Echo [phone] with chart  . Hypertension     lately hypotension, 12'14-Amlodipine stopped, then improved    Assessment/Plan: 1 Day Post-Op  Procedure(s) (LRB): LEFT TOTAL HIP ARTHROPLASTY ANTERIOR APPROACH (Left) Principal Problem:   OA (osteoarthritis) of hip Active Problems:   Postoperative anemia due to acute blood loss   Hyponatremia  Estimated body mass index is 21.68 kg/(m^2) as calculated from the following:   Height as of this encounter: 5' (1.524 m).   Weight as of this encounter: 50.349 kg (111 lb). Advance diet Up with therapy Plan for discharge tomorrow Discharge home with home health  DVT Prophylaxis - Xarelto, Aspirin 81 mg on hold Weight Bearing As Tolerated left Leg Hemovac Pulled Begin Therapy   Erin [name], Erin [name] 07/20/2013, 8:07 AM

## 2013-07-21 LAB — BASIC METABOLIC PANEL
BUN: 9 mg/dL (ref 6–23)
CALCIUM: 9.3 mg/dL (ref 8.4–10.5)
CO2: 25 mEq/L (ref 19–32)
CREATININE: 0.6 mg/dL (ref 0.50–1.10)
Chloride: 94 mEq/L — ABNORMAL LOW (ref 96–112)
GFR calc Af Amer: >90 mL/min (ref >90)
GFR, EST NON AFRICAN AMERICAN: 88 mL/min — AB (ref >90)
Glucose, Bld: 99 mg/dL (ref 70–99)
Potassium: 4 mEq/L (ref 3.7–5.3)
Sodium: 130 mEq/L — ABNORMAL LOW (ref 137–147)

## 2013-07-21 LAB — CBC
HCT: 24.9 % — ABNORMAL LOW (ref 36.0–46.0)
Hemoglobin: 8.5 g/dL — ABNORMAL LOW (ref 12.0–15.0)
MCH: 31.6 pg (ref 26.0–34.0)
MCHC: 34.1 g/dL (ref 30.0–36.0)
MCV: 92.6 fL (ref 78.0–100.0)
Platelets: 247 10*3/uL (ref 150–400)
RBC: 2.69 MIL/uL — ABNORMAL LOW (ref 3.87–5.11)
RDW: 13.1 % (ref 11.5–15.5)
WBC: 9 10*3/uL (ref 4.0–10.5)

## 2013-07-21 MED ORDER — RIVAROXABAN 10 MG PO TABS: 10.0000 mg | ORAL_TABLET | Freq: Every day | ORAL | Status: DC

## 2013-07-21 MED ORDER — OXYCODONE HCL 5 MG PO TABS: 5.0000 mg | ORAL_TABLET | ORAL | Status: DC | PRN

## 2013-07-21 MED ORDER — ONDANSETRON HCL 4 MG PO TABS: 4.0000 mg | ORAL_TABLET | Freq: Four times a day (QID) | ORAL | Status: DC | PRN

## 2013-07-21 MED ORDER — METHOCARBAMOL 500 MG PO TABS: 500.0000 mg | ORAL_TABLET | Freq: Four times a day (QID) | ORAL | Status: DC | PRN

## 2013-07-21 MED ORDER — TRAMADOL HCL 50 MG PO TABS: 50.0000 mg | ORAL_TABLET | Freq: Four times a day (QID) | ORAL | Status: DC | PRN

## 2013-07-21 MED ORDER — OXYCODONE HCL 5 MG PO TABS
5.0000 mg | ORAL_TABLET | ORAL | Status: DC | PRN
  Administered 2013-07-21: 10 mg via ORAL
  Filled 2013-07-21: qty 2

## 2013-07-21 MED ORDER — HYDROCODONE-ACETAMINOPHEN 5-325 MG PO TABS
1.0000 | ORAL_TABLET | ORAL | Status: DC | PRN
Start: 2013-07-21 — End: 2013-07-21

## 2013-07-21 MED ORDER — POLYSACCHARIDE IRON COMPLEX 150 MG PO CAPS: 150.0000 mg | ORAL_CAPSULE | Freq: Two times a day (BID) | ORAL | Status: DC

## 2013-07-21 MED ORDER — POLYSACCHARIDE IRON COMPLEX 150 MG PO CAPS
150.0000 mg | ORAL_CAPSULE | Freq: Every day | ORAL | Status: DC
  Administered 2013-07-21: 150 mg via ORAL
  Filled 2013-07-21: qty 1

## 2013-07-21 NOTE — Progress Notes (Signed)
RN reviewed discharge instructions with patient and family. All questions answered.   Paperwork, and prescriptions given to patient.  NT discharged patient to family car in wheelchair.

## 2013-07-21 NOTE — Progress Notes (Signed)
Physical Therapy Treatment Patient Details Name: Erin [name] MRN: [id number] DOB: 14-Jun-1940 Today's Date: 07/21/2013 Time: [id number] PT Time Calculation (min): 32 min  PT Assessment / Plan / Recommendation  History of Present Illness Pt was admitted for L DA THA   PT Comments   Reviewed car transfers and stairs with pt - stair hand out provided  Follow Up Recommendations  Home health PT     Does the patient have the potential to tolerate intense rehabilitation     Barriers to Discharge        Equipment Recommendations  Rolling walker with 5" wheels    Recommendations for Other Services OT consult  Frequency 7X/week   Progress towards PT Goals Progress towards PT goals: Progressing toward goals  Plan Current plan remains appropriate    Precautions / Restrictions Precautions Precautions: Fall Restrictions Weight Bearing Restrictions: No Other Position/Activity Restrictions: WBAT   Pertinent Vitals/Pain 4-5/10; premed, ice pack declined    Mobility  Bed Mobility Overal bed mobility: Needs Assistance Bed Mobility: Sit to Supine;Supine to Sit Supine to sit: Min guard Sit to supine: Min guard General bed mobility comments: cues for use of L LE to self assist; pt educated on use of sheet to assist R LE Transfers Overall transfer level: Needs assistance Equipment used: Rolling walker (2 wheeled) Transfers: Sit to/from Stand Sit to Stand: Supervision General transfer comment: cues for LE management and use of UEs to self assist Ambulation/Gait Ambulation/Gait assistance: Min guard;Supervision Ambulation Distance (Feet): 200 Feet Assistive device: Rolling walker (2 wheeled) Gait Pattern/deviations: Step-to pattern;Step-through pattern;Trunk flexed;Shuffle General Gait Details: cues for posture and position from RW Stairs: Yes Stairs assistance: Min assist Stair Management: No rails;Step to pattern;Backwards;With walker Number of Stairs: 2 General stair comments:  cues for sequence and foot/RW placement    Exercises Total Joint Exercises Ankle Circles/Pumps: AROM;Both;10 reps;Supine Quad Sets: AROM;Both;10 reps;Supine Gluteal Sets: AROM;Both;10 reps;Supine Heel Slides: AAROM;Supine;Left;20 reps Hip ABduction/ADduction: AAROM;Left;Supine;20 reps   PT Diagnosis:    PT Problem List:   PT Treatment Interventions:     PT Goals (current goals can now be found in the care plan section) Acute Rehab PT Goals Patient Stated Goal: home PT Goal Formulation: With patient Time For Goal Achievement: 07/27/13 Potential to Achieve Goals: Good  Visit Information  Last PT Received On: 07/21/13 Assistance Needed: +1 History of Present Illness: Pt was admitted for L DA THA    Subjective Data  Patient Stated Goal: home   Cognition  Cognition Arousal/Alertness: Awake/alert Behavior During Therapy: WFL for tasks assessed/performed Overall Cognitive Status: Within Functional Limits for tasks assessed    Balance     End of Session PT - End of Session Equipment Utilized During Treatment: Gait belt Activity Tolerance: Patient tolerated treatment well Patient left: in chair;with call bell/phone within reach Nurse Communication: Mobility status   GP     Erin [name] 07/21/2013, 9:11 AM

## 2013-07-21 NOTE — Progress Notes (Signed)
OT Cancellation Note  Patient Details Name: Timmothy Eulerlice S Scism MRN: [id number] DOB: 04/30/1941   Cancelled Treatment:    Reason Eval/Treat Not Completed: Other (comment) Checked with pt.  No further OT needs.  Will sign off.   Kristian Hazzard 07/21/2013, 8:14 AM Marica OtterMaryellen Rohini Jaroszewski, OTR/L [phone] 07/21/2013

## 2013-07-21 NOTE — Progress Notes (Signed)
   CARE MANAGEMENT NOTE 07/21/2013  Patient:  Erin [name],Erin [name]   Account Number:  [id number]  Date Initiated:  07/20/2013  Documentation initiated by:  [facility],Devarion Mcclanahan  Subjective/Objective Assessment:   LEFT TOTAL HIP ARTHROPLASTY ANTERIOR APPROACH     Action/Plan:   HH recommended   Anticipated DC Date:  07/21/2013   Anticipated DC Plan:  HOME W HOME HEALTH SERVICES      DC Planning Services  CM consult      [facility]   Choice offered to / List presented to:  C-1 Patient   DME arranged  [facility]      DME agency  [facility]     HH arranged  HH[facility]2 PT      [facility]   Status of service:  Completed, signed off Medicare Important Message given?   (If response is "NO", the following Medicare IM given date fields will be blank) Date Medicare IM given:   Date Additional Medicare IM given:    Discharge Disposition:  HOME W HOME HEALTH SERVICES  Per UR Regulation:    If discussed at Long Length of Stay Meetings, dates discussed:    Comments:  07/21/2013 1045 NCM [facility] and they do accept [employer] and can accept referral. They can do soc on 07/24/2013. [facility] and they do not accept [employer]. Faxed dc summary, facesheet and orders/[id number] [facility]. Isidoro Donninglesia Jamyrah Saur RN CCM Case Mgmt [phone]  07/20/2013 1700 NCM spoke to pt and offered [facility] and provided [facility]. She did not have [facility] but just agency that will accept insurance. [place] [facility] [place] on 2[place]20. Pt has RW in room. Isidoro DonningAlesia Zymire Turnbo RN CCM Case Mgmt phone [phone]

## 2013-07-21 NOTE — Discharge Summary (Signed)
Physician Discharge Summary   Patient ID: Erin [name] MRN: [id number] DOB/AGE: [birth year] 75 y.o.  Admit date: 07/19/2013 Discharge date: 07-21-2013  Primary Diagnosis:  Osteoarthritis of the Left hip.   Admission Diagnoses:  Past Medical History  Diagnosis Date  . Hyponatremia 07-13-13    hx. of   . GERD (gastroesophageal reflux disease)   . Arthritis 07-13-13    osteoarthritis  . Abnormal EKG 07-13-13    hx. of -Dr. Clayborn Bigness, [place], [place]- last Echo [phone] with chart  . Hypertension     lately hypotension, 12'14-Amlodipine stopped, then improved   Discharge Diagnoses:   Principal Problem:   OA (osteoarthritis) of hip Active Problems:   Postoperative anemia due to acute blood loss   Hyponatremia  Estimated body mass index is 21.68 kg/(m^2) as calculated from the following:   Height as of this encounter: 5' (1.524 m).   Weight as of this encounter: 50.349 kg (111 lb).  Procedure(s) (LRB): LEFT TOTAL HIP ARTHROPLASTY ANTERIOR APPROACH (Left)   Consults: None  HPI: Erin [name] is a 73 y.o. female who has advanced end-  stage arthritis of his Left hip with progressively worsening pain and  Dysfunction. She has rapidly progressive OA and has essentially destroyed nearly her entire femoral head in a six month time span.The patient has failed nonoperative management and presents for  total hip arthroplasty.   Laboratory Data: Admission on 07/19/2013  Component Date Value Ref Range Status  . WBC 07/20/2013 8.1  4.0 - 10.5 K/uL Final  . RBC 07/20/2013 2.93* 3.87 - 5.11 MIL/uL Final  . Hemoglobin 07/20/2013 9.2* 12.0 - 15.0 g/dL Final  . HCT 07/20/2013 27.2* 36.0 - 46.0 % Final  . MCV 07/20/2013 92.8  78.0 - 100.0 fL Final  . MCH 07/20/2013 31.4  26.0 - 34.0 pg Final  . MCHC 07/20/2013 33.8  30.0 - 36.0 g/dL Final  . RDW 07/20/2013 12.6  11.5 - 15.5 % Final  . Platelets 07/20/2013 230  150 - 400 K/uL Final  . Sodium 07/20/2013 130* 137 - 147 mEq/L Final  . Potassium  07/20/2013 4.8  3.7 - 5.3 mEq/L Final  . Chloride 07/20/2013 94* 96 - 112 mEq/L Final  . CO2 07/20/2013 22  19 - 32 mEq/L Final  . Glucose, Bld 07/20/2013 163* 70 - 99 mg/dL Final  . BUN 07/20/2013 10  6 - 23 mg/dL Final  . Creatinine, Ser 07/20/2013 0.62  0.50 - 1.10 mg/dL Final  . Calcium 07/20/2013 8.6  8.4 - 10.5 mg/dL Final  . GFR calc non Af Amer 07/20/2013 87* >90 mL/min Final  . GFR calc Af Amer 07/20/2013 >90  >90 mL/min Final   Comment: (NOTE)                          The eGFR has been calculated using the CKD EPI equation.                          This calculation has not been validated in all clinical situations.                          eGFR's persistently <90 mL/min signify possible Chronic Kidney                          Disease.  . WBC 07/21/2013 9.0  4.0 - 10.5  K/uL Final  . RBC 07/21/2013 2.69* 3.87 - 5.11 MIL/uL Final  . Hemoglobin 07/21/2013 8.5* 12.0 - 15.0 g/dL Final  . HCT 07/21/2013 24.9* 36.0 - 46.0 % Final  . MCV 07/21/2013 92.6  78.0 - 100.0 fL Final  . MCH 07/21/2013 31.6  26.0 - 34.0 pg Final  . MCHC 07/21/2013 34.1  30.0 - 36.0 g/dL Final  . RDW 07/21/2013 13.1  11.5 - 15.5 % Final  . Platelets 07/21/2013 247  150 - 400 K/uL Final  . Sodium 07/21/2013 130* 137 - 147 mEq/L Final  . Potassium 07/21/2013 4.0  3.7 - 5.3 mEq/L Final  . Chloride 07/21/2013 94* 96 - 112 mEq/L Final  . CO2 07/21/2013 25  19 - 32 mEq/L Final  . Glucose, Bld 07/21/2013 99  70 - 99 mg/dL Final  . BUN 07/21/2013 9  6 - 23 mg/dL Final  . Creatinine, Ser 07/21/2013 0.60  0.50 - 1.10 mg/dL Final  . Calcium 07/21/2013 9.3  8.4 - 10.5 mg/dL Final  . GFR calc non Af Amer 07/21/2013 88* >90 mL/min Final  . GFR calc Af Amer 07/21/2013 >90  >90 mL/min Final   Comment: (NOTE)                          The eGFR has been calculated using the CKD EPI equation.                          This calculation has not been validated in all clinical situations.                          eGFR's  persistently <90 mL/min signify possible Chronic Kidney                          Disease.  Hospital Outpatient Visit on 07/13/2013  Component Date Value Ref Range Status  . MRSA, PCR 07/13/2013 NEGATIVE  NEGATIVE Final  . Staphylococcus aureus 07/13/2013 NEGATIVE  NEGATIVE Final   Comment:                                 The Xpert SA Assay (FDA                          approved for NASAL specimens                          in patients over 81 years of age),                          is one component of                          a comprehensive surveillance                          program.  Test performance has                          been validated by [facility] for patients greater                          than or equal to 59 year old.                          It is not intended                          to diagnose infection nor to                          guide or monitor treatment.  Marland Kitchen aPTT 07/13/2013 33  24 - 37 seconds Final  . WBC 07/13/2013 6.6  4.0 - 10.5 K/uL Final  . RBC 07/13/2013 4.11  3.87 - 5.11 MIL/uL Final  . Hemoglobin 07/13/2013 13.2  12.0 - 15.0 g/dL Final  . HCT 07/13/2013 38.4  36.0 - 46.0 % Final  . MCV 07/13/2013 93.4  78.0 - 100.0 fL Final  . MCH 07/13/2013 32.1  26.0 - 34.0 pg Final  . MCHC 07/13/2013 34.4  30.0 - 36.0 g/dL Final  . RDW 07/13/2013 12.9  11.5 - 15.5 % Final  . Platelets 07/13/2013 275  150 - 400 K/uL Final  . Sodium 07/13/2013 130* 137 - 147 mEq/L Final  . Potassium 07/13/2013 4.7  3.7 - 5.3 mEq/L Final  . Chloride 07/13/2013 89* 96 - 112 mEq/L Final  . CO2 07/13/2013 25  19 - 32 mEq/L Final  . Glucose, Bld 07/13/2013 111* 70 - 99 mg/dL Final  . BUN 07/13/2013 11  6 - 23 mg/dL Final  . Creatinine, Ser 07/13/2013 0.58  0.50 - 1.10 mg/dL Final  . Calcium 07/13/2013 10.1  8.4 - 10.5 mg/dL Final  . Total Protein 07/13/2013 7.1  6.0 - 8.3 g/dL Final  . Albumin 07/13/2013 3.9  3.5 - 5.2 g/dL Final  . AST 07/13/2013 17   0 - 37 U/L Final  . ALT 07/13/2013 21  0 - 35 U/L Final  . Alkaline Phosphatase 07/13/2013 67  39 - 117 U/L Final  . Total Bilirubin 07/13/2013 0.3  0.3 - 1.2 mg/dL Final  . GFR calc non Af Amer 07/13/2013 89* >90 mL/min Final  . GFR calc Af Amer 07/13/2013 >90  >90 mL/min Final   Comment: (NOTE)                          The eGFR has been calculated using the CKD EPI equation.                          This calculation has not been validated in all clinical situations.                          eGFR's persistently <90 mL/min signify possible Chronic Kidney                          Disease.  Marland Kitchen Prothrombin Time 07/13/2013 12.3  11.6 - 15.2 seconds Final  . INR 07/13/2013 0.93  0.00 - 1.49 Final  . ABO/RH(D) 07/13/2013 A POS   Final  . Antibody Screen 07/13/2013 NEG   Final  . Sample Expiration 07/13/2013 07/22/2013   Final  . Color,  Urine 07/13/2013 YELLOW  YELLOW Final  . APPearance 07/13/2013 CLOUDY* CLEAR Final  . Specific Gravity, Urine 07/13/2013 1.019  1.005 - 1.030 Final  . pH 07/13/2013 7.0  5.0 - 8.0 Final  . Glucose, UA 07/13/2013 NEGATIVE  NEGATIVE mg/dL Final  . Hgb urine dipstick 07/13/2013 NEGATIVE  NEGATIVE Final  . Bilirubin Urine 07/13/2013 NEGATIVE  NEGATIVE Final  . Ketones, ur 07/13/2013 NEGATIVE  NEGATIVE mg/dL Final  . Protein, ur 07/13/2013 NEGATIVE  NEGATIVE mg/dL Final  . Urobilinogen, UA 07/13/2013 0.2  0.0 - 1.0 mg/dL Final  . Nitrite 07/13/2013 NEGATIVE  NEGATIVE Final  . Leukocytes, UA 07/13/2013 NEGATIVE  NEGATIVE Final   MICROSCOPIC NOT DONE ON URINES WITH NEGATIVE PROTEIN, BLOOD, LEUKOCYTES, NITRITE, OR GLUCOSE <1000 mg/dL.     X-Rays:Dg Chest 2 View  07/13/2013   CLINICAL DATA:  Preop left total hip replacement  EXAM: CHEST  2 VIEW  COMPARISON:  11/16/2006  FINDINGS: Mild cardiac enlargement. Mild bibasilar scarring. Vascular pattern normal. Lungs clear except for bibasilar scarring.  IMPRESSION: No active cardiopulmonary disease.   Electronically Signed    By: Skipper Cliche M.D.   On: 07/13/2013 14:03   Dg Hip Complete Left  07/13/2013   CLINICAL DATA:  Left hip osteoarthritis.  EXAM: LEFT HIP - COMPLETE 2+ VIEW  COMPARISON:  None.  FINDINGS: Status post right total hip arthroplasty. There is severe deformity of the left femoral head with subsequent joint space narrowing consistent with severe degenerative joint disease. Sacroiliac joints and pubic symphysis appear normal. Degenerative changes of the lower lumbar spine are noted.  IMPRESSION: Status post right total hip arthroplasty. 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   Electronically Signed   By: Sabino Dick M.D.   On: 07/13/2013 14:32   Dg Pelvis Portable  07/19/2013   CLINICAL DATA:  Left hip replacement.  EXAM: PORTABLE PELVIS 1-2 VIEWS  COMPARISON:  11/22/2006  FINDINGS: Portable AP view of the pelvis shows the patient to be status post interval left total hip replacement. No evidence for immediate hardware complications. Surgical drain overlies the lateral soft tissues. Gas in the soft tissues of the left hip region is consistent with the postoperative state of this individual.  IMPRESSION: Status post left total hip replacement. No evidence for immediate hardware complications.   Electronically Signed   By: Misty Stanley M.D.   On: 07/19/2013 19:00   Dg C-arm 1-60 Min-no Report  07/19/2013   CLINICAL DATA: left total hip replacement   C-ARM 1-60 MINUTES  Fluoroscopy was utilized by the requesting physician.  No radiographic  interpretation.     EKG:No orders found for this or any previous visit.   Hospital Course: Patient was admitted to [facility] and taken to the OR and underwent the above state procedure without complications.  Patient tolerated the procedure well and was later transferred to the recovery room and then to the orthopaedic floor for postoperative care.   They were given PO and IV analgesics for pain control following their surgery.  They were given 24 hours of postoperative antibiotics of  Anti-infectives   Start     Dose/Rate Route Frequency Ordered Stop   07/20/13 0600  ceFAZolin (ANCEF) IVPB 2 g/50 mL premix  Status:  Discontinued     2 g 100 mL/hr over 30 Minutes Intravenous On call to O.R. 07/19/13 1327 07/19/13 1333   07/20/13 0600  vancomycin (  VANCOCIN) IVPB 1000 mg/200 mL premix     1,000 mg 200 mL/hr over 60 Minutes Intravenous Every 12 hours 07/19/13 2035 07/20/13 0718   07/19/13 1345  vancomycin (VANCOCIN) IVPB 1000 mg/200 mL premix     1,000 mg 200 mL/hr over 60 Minutes Intravenous  Once 07/19/13 1327 07/19/13 1752     and started on DVT prophylaxis in the form of Xarelto.   PT and OT were ordered for total hip protocol.  The patient was allowed to be WBAT with therapy. Discharge planning was consulted to help with postop disposition and equipment needs.  Patient had a decent night on the evening of surgery.  They started to get up OOB with therapy on day one.  Hemovac drain was pulled without difficulty.  Continued to work with therapy into day two.  Dressing was changed on day two and the incision was healing well. Patient was seen in rounds by Dr. Wynelle Link on day two and was ready to go home.  She had improved with her mobility and progressed with therapy.  Discharge home with home health  Diet - Cardiac diet  Follow up - in 2 weeks  Activity - WBAT  Disposition - Home  Condition Upon Discharge - Good  D/C Meds - See DC Summary  DVT Prophylaxis - Xarelto, Aspirin 81 mg on hold       Discharge Orders   Future Orders Complete By Expires   Call MD / Call 911  As directed    Comments:     If you experience chest pain or shortness of breath, CALL 911 and be transported to the hospital emergency room.  If you develope a fever above 101 F, pus (white drainage) or increased drainage or redness at the wound, or calf pain, call  your surgeon's office.   Change dressing  As directed    Comments:     You may change your dressing dressing daily with sterile 4 x 4 inch gauze dressing and paper tape.  Do not submerge the incision under water.   Constipation Prevention  As directed    Comments:     Drink plenty of fluids.  Prune juice may be helpful.  You may use a stool softener, such as Colace (over the counter) 100 mg twice a day.  Use MiraLax (over the counter) for constipation as needed.   Diet - low sodium heart healthy  As directed    Discharge instructions  As directed    Comments:     Pick up stool softner and laxative for home. Do not submerge incision under water. May shower. Continue to use ice for pain and swelling from surgery.  Total Hip Protocol.  Take Xarelto for two and a half more weeks, then discontinue Xarelto. Once the patient has completed the Xarelto, they may resume the 81 mg Aspirin.   Do not sit on low chairs, stoools or toilet seats, as it may be difficult to get up from low surfaces  As directed    Driving restrictions  As directed    Comments:     No driving until released by the physician.   Increase activity slowly as tolerated  As directed    Lifting restrictions  As directed    Comments:     No lifting until released by the physician.   Patient may shower  As directed    Comments:     You may shower without a dressing once there is no drainage.  Do  not wash over the wound.  If drainage remains, do not shower until drainage stops.   TED hose  As directed    Comments:     Use stockings (TED hose) for 3 weeks on both leg(s).  You may remove them at night for sleeping.   Weight bearing as tolerated  As directed    Questions:     Laterality:     Extremity:         Medication List    STOP taking these medications       acetaminophen-codeine 300-30 MG per tablet  Commonly known as:  TYLENOL #3     aspirin EC 81 MG tablet     fentaNYL 50 MCG/HR  Commonly known as:   DURAGESIC - dosed mcg/hr      TAKE these medications       fexofenadine 180 MG tablet  Commonly known as:  ALLEGRA  Take 180 mg by mouth daily.     iron polysaccharides 150 MG capsule  Commonly known as:  NIFEREX  Take 1 capsule (150 mg total) by mouth 2 (two) times daily.     lisinopril 40 MG tablet  Commonly known as:  PRINIVIL,ZESTRIL  Take 40 mg by mouth every morning.     methocarbamol 500 MG tablet  Commonly known as:  ROBAXIN  Take 1 tablet (500 mg total) by mouth every 6 (six) hours as needed for muscle spasms.     ondansetron 4 MG tablet  Commonly known as:  ZOFRAN  Take 1 tablet (4 mg total) by mouth every 6 (six) hours as needed for nausea.     oxyCODONE 5 MG immediate release tablet  Commonly known as:  Oxy IR/ROXICODONE  Take 1-2 tablets (5-10 mg total) by mouth every 3 (three) hours as needed for moderate pain or severe pain.     rivaroxaban 10 MG Tabs tablet  Commonly known as:  XARELTO  - Take 1 tablet (10 mg total) by mouth daily with breakfast. Take Xarelto for two and a half more weeks, then discontinue Xarelto.  - Once the patient has completed the Xarelto, they may resume the 81 mg Aspirin.     traMADol 50 MG tablet  Commonly known as:  ULTRAM  Take 1-2 tablets (50-100 mg total) by mouth every 6 (six) hours as needed (mild to moderate pain).       Follow-up Information   Follow up with Gearlean Alf, MD. Schedule an appointment as soon as possible for a visit on 08/03/2013.   Specialty:  Orthopedic Surgery   Contact information:   [address] [place] [address] [phone]       Signed: Mickel Crow 07/21/2013, 8:28 AM

## 2013-07-21 NOTE — Discharge Instructions (Signed)
°Dr. Frank Aluisio °Total Joint Specialist °[facility] °[address] °[place], [place] [address] °[phone] ° ° ° °ANTERIOR APPROACH TOTAL HIP REPLACEMENT POSTOPERATIVE DIRECTIONS ° ° °Hip Rehabilitation, Guidelines Following Surgery  °The results of a hip operation are greatly improved after range of motion and muscle strengthening exercises. Follow all safety measures which are given to protect your hip. If any of these exercises cause increased pain or swelling in your joint, decrease the amount until you are comfortable again. Then slowly increase the exercises. Call your caregiver if you have problems or questions.  °HOME CARE INSTRUCTIONS  °Most of the following instructions are designed to prevent the dislocation of your new hip.  °Remove items at home which could result in a fall. This includes throw rugs or furniture in walking pathways.  °Continue medications as instructed at time of discharge. °· You may have some home medications which will be placed on hold until you complete the course of blood thinner medication. °· You may start showering once you are discharged home but do not submerge the incision under water. Just pat the incision dry and apply a dry gauze dressing on daily. °Do not put on socks or shoes without following the instructions of your caregivers.  °Sit on high chairs which makes it easier to stand.  °Sit on chairs with arms. Use the chair arms to help push yourself up when arising.  °Keep your leg on the side of the operation out in front of you when standing up.  °Arrange for the use of a toilet seat elevator so you are not sitting low.   °· Walk with walker as instructed.  °You may resume a sexual relationship in one month or when given the OK by your caregiver.  °Use walker as long as suggested by your caregivers.  °You may put full weight on your legs and walk as much as is comfortable. °Avoid periods of inactivity such as sitting longer than an hour  when not asleep. This helps prevent blood clots.  °You may return to work once you are cleared by your surgeon.  °Do not drive a car for 6 weeks or until released by your surgeon.  °Do not drive while taking narcotics.  °Wear elastic stockings for three weeks following surgery during the day but you may remove then at night.  °Make sure you keep all of your appointments after your operation with all of your doctors and caregivers. You should call the office at the above phone number and make an appointment for approximately two weeks after the date of your surgery. °Change the dressing daily and reapply a dry dressing each time. °Please pick up a stool softener and laxative for home use as long as you are requiring pain medications. °· Continue to use ice on the hip for pain and swelling from surgery. You may notice swelling that will progress down to the foot and ankle.  This is normal after  surgery.  Elevate the leg when you are not up walking on it.   °It is important for you to complete the blood thinner medication as prescribed by your doctor. °· Continue to use the breathing machine which will help keep your temperature down.  It is common for your temperature to cycle up and down following surgery, especially at night when you are not up moving around and exerting yourself.  The breathing machine keeps your lungs expanded and your temperature down. ° °RANGE OF MOTION AND STRENGTHENING EXERCISES  °  These exercises are designed to help you keep full movement of your hip joint. Follow your caregiver's or physical therapist's instructions. Perform all exercises about fifteen times, three times per day or as directed. Exercise both hips, even if you have had only one joint replacement. These exercises can be done on a training (exercise) mat, on the floor, on a table or on a bed. Use whatever works the best and is most comfortable for you. Use music or television while you are exercising so that the exercises are  a pleasant break in your day. This will make your life better with the exercises acting as a break in routine you can look forward to.  °Lying on your back, slowly slide your foot toward your buttocks, raising your knee up off the floor. Then slowly slide your foot back down until your leg is straight again.  °Lying on your back spread your legs as far apart as you can without causing discomfort.  °Lying on your side, raise your upper leg and foot straight up from the floor as far as is comfortable. Slowly lower the leg and repeat.  °Lying on your back, tighten up the muscle in the front of your thigh (quadriceps muscles). You can do this by keeping your leg straight and trying to raise your heel off the floor. This helps strengthen the largest muscle supporting your knee.  °Lying on your back, tighten up the muscles of your buttocks both with the legs straight and with the knee bent at a comfortable angle while keeping your heel on the floor.  ° °SKILLED REHAB INSTRUCTIONS: °If the patient is transferred to a skilled rehab facility following release from the hospital, a list of the current medications will be sent to the facility for the patient to continue.  When discharged from the skilled rehab facility, please have the facility set up the patient's Home Health Physical Therapy prior to being released. Also, the skilled facility will be responsible for providing the patient with their medications at time of release from the facility to include their pain medication, the muscle relaxants, and their blood thinner medication. If the patient is still at the rehab facility at time of the two week follow up appointment, the skilled rehab facility will also need to assist the patient in arranging follow up appointment in our office and any transportation needs. ° °MAKE SURE YOU:  °Understand these instructions.  °Will watch your condition.  °Will get help right away if you are not doing well or get worse. ° °Pick up  stool softner and laxative for home. °Do not submerge incision under water. °May shower. °Continue to use ice for pain and swelling from surgery. °Total Hip Protocol. ° °Take Xarelto for two and a half more weeks, then discontinue Xarelto. °Once the patient has completed the Xarelto, they may resume the 81 mg Aspirin. ° °

## 2013-07-21 NOTE — Progress Notes (Signed)
   Subjective: 2 Days Post-Op Procedure(s) (LRB): LEFT TOTAL HIP ARTHROPLASTY ANTERIOR APPROACH (Left) Patient reports pain as mild.   Patient seen in rounds with Dr. Lequita HaltAluisio. Patient is well, but has had some minor complaints of pain in the hip, requiring pain medications Patient is ready to go home today after therapy.  Objective: Vital signs in last 24 hours: Temp:  [97.4 F (36.3 C)-98.4 F (36.9 C)] 98.2 F (36.8 C) (02/20 0427) Pulse Rate:  [81-102] 81 (02/20 0427) Resp:  [16-20] 18 (02/19 2153) BP: (93-130)/(55-67) 127/65 mmHg (02/20 0427) SpO2:  [97 %-100 %] 100 % (02/20 0427)  Intake/Output from previous day:  Intake/Output Summary (Last 24 hours) at 07/21/13 0821 Last data filed at 07/21/13 0428  Gross per 24 hour  Intake    600 ml  Output    850 ml  Net   -250 ml    Intake/Output this shift:    Labs:  Recent Labs  07/20/13 0435 07/21/13 0415  HGB 9.2* 8.5*    Recent Labs  07/20/13 0435 07/21/13 0415  WBC 8.1 9.0  RBC 2.93* 2.69*  HCT 27.2* 24.9*  PLT 230 247    Recent Labs  07/20/13 0435 07/21/13 0415  NA 130* 130*  K 4.8 4.0  CL 94* 94*  CO2 22 25  BUN 10 9  CREATININE 0.62 0.60  GLUCOSE 163* 99  CALCIUM 8.6 9.3   No results found for this basename: LABPT, INR,  in the last 72 hours  EXAM: General - Patient is Alert, Appropriate and Oriented Extremity - Neurovascular intact Sensation intact distally Incision - clean, dry, no drainage, healing Motor Function - intact, moving foot and toes well on exam.   Assessment/Plan: 2 Days Post-Op Procedure(s) (LRB): LEFT TOTAL HIP ARTHROPLASTY ANTERIOR APPROACH (Left) Procedure(s) (LRB): LEFT TOTAL HIP ARTHROPLASTY ANTERIOR APPROACH (Left) Past Medical History  Diagnosis Date  . Hyponatremia 07-13-13    hx. of   . GERD (gastroesophageal reflux disease)   . Arthritis 07-13-13    osteoarthritis  . Abnormal EKG 07-13-13    hx. [place] [phone] with chart    . Hypertension     lately hypotension, 12'14-Amlodipine stopped, then improved   Principal Problem:   OA (osteoarthritis) of hip Active Problems:   Postoperative anemia due to acute blood loss   Hyponatremia  Estimated body mass index is 21.68 kg/(m^2) as calculated from the following:   Height as of this encounter: 5' (1.524 m).   Weight as of this encounter: 50.349 kg (111 lb). Up with therapy Discharge home with home health Diet - Cardiac diet Follow up - in 2 weeks Activity - WBAT Disposition - Home Condition Upon Discharge - Good D/C Meds - See DC Summary DVT Prophylaxis - Xarelto, Aspirin 81 mg on hold  Kanton Kamel 07/21/2013, 8:21 AM

## 2013-08-01 ENCOUNTER — Other Ambulatory Visit: Payer: Self-pay | Admitting: Orthopedic Surgery

## 2014-07-03 ENCOUNTER — Inpatient Hospital Stay: Payer: Self-pay | Admitting: Internal Medicine

## 2014-07-03 LAB — CBC WITH DIFFERENTIAL/PLATELET
BASOS ABS: 0.1 10*3/uL (ref 0.0–0.1)
BASOS PCT: 1.2 %
EOS PCT: 2.8 %
Eosinophil #: 0.3 10*3/uL (ref 0.0–0.7)
HCT: 35.1 % (ref 35.0–47.0)
HGB: 11.7 g/dL — AB (ref 12.0–16.0)
LYMPHS ABS: 1.2 10*3/uL (ref 1.0–3.6)
Lymphocyte %: 13.7 %
MCH: 33.2 pg (ref 26.0–34.0)
MCHC: 33.4 g/dL (ref 32.0–36.0)
MCV: 100 fL (ref 80–100)
Monocyte #: 0.5 x10 3/mm (ref 0.2–0.9)
Monocyte %: 5.3 %
Neutrophil #: 7 10*3/uL — ABNORMAL HIGH (ref 1.4–6.5)
Neutrophil %: 77 %
Platelet: 370 10*3/uL (ref 150–440)
RBC: 3.52 10*6/uL — AB (ref 3.80–5.20)
RDW: 14.3 % (ref 11.5–14.5)
WBC: 9.1 10*3/uL (ref 3.6–11.0)

## 2014-07-03 LAB — TROPONIN I
Troponin-I: 0.03 ng/mL
Troponin-I: 0.13 ng/mL — ABNORMAL HIGH
Troponin-I: 0.08 ng/mL — ABNORMAL HIGH

## 2014-07-03 LAB — URINALYSIS, COMPLETE
Bacteria: NONE SEEN
Bilirubin,UR: NEGATIVE
Blood: NEGATIVE
GLUCOSE, UR: NEGATIVE mg/dL (ref 0–75)
Ketone: NEGATIVE
Leukocyte Esterase: NEGATIVE
NITRITE: NEGATIVE
Ph: 5 (ref 4.5–8.0)
Protein: NEGATIVE
RBC,UR: <1 /HPF (ref 0–5)
SPECIFIC GRAVITY: 1.009 (ref 1.003–1.030)
Squamous Epithelial: 1

## 2014-07-03 LAB — COMPREHENSIVE METABOLIC PANEL
ALBUMIN: 3.2 g/dL — AB (ref 3.4–5.0)
ALK PHOS: 197 U/L — AB (ref 46–116)
ANION GAP: 11 (ref 7–16)
BUN: 23 mg/dL — ABNORMAL HIGH (ref 7–18)
Bilirubin,Total: 0.5 mg/dL (ref 0.2–1.0)
CALCIUM: 9 mg/dL (ref 8.5–10.1)
Chloride: 107 mmol/L (ref 98–107)
Co2: 22 mmol/L (ref 21–32)
Creatinine: 0.99 mg/dL (ref 0.60–1.30)
EGFR (African American): >60
GFR CALC NON AF AMER: 58 — AB
GLUCOSE: 101 mg/dL — AB (ref 65–99)
OSMOLALITY: 283 (ref 275–301)
POTASSIUM: 4.2 mmol/L (ref 3.5–5.1)
SGOT(AST): 82 U/L — ABNORMAL HIGH (ref 15–37)
SGPT (ALT): 215 U/L — ABNORMAL HIGH (ref 14–63)
Sodium: 140 mmol/L (ref 136–145)
Total Protein: 7 g/dL (ref 6.4–8.2)

## 2014-07-03 LAB — LIPID PANEL
CHOLESTEROL: 208 mg/dL — AB (ref 0–200)
HDL Cholesterol: 63 mg/dL — ABNORMAL HIGH (ref 40–60)
Ldl Cholesterol, Calc: 122 mg/dL — ABNORMAL HIGH (ref 0–100)
Triglycerides: 114 mg/dL (ref 0–200)
VLDL CHOLESTEROL, CALC: 23 mg/dL (ref 5–40)

## 2014-07-03 LAB — PROTIME-INR
INR: 1.2
PROTHROMBIN TIME: 15.1 s — AB

## 2014-07-03 LAB — PRO B NATRIURETIC PEPTIDE: B-TYPE NATIURETIC PEPTID: 2364 pg/mL — AB (ref 0–450)

## 2014-07-03 LAB — CK TOTAL AND CKMB (NOT AT ARMC)
CK, Total: 17 U/L — ABNORMAL LOW (ref 26–192)
CK-MB: <0.5 ng/mL — ABNORMAL LOW (ref 0.5–3.6)

## 2014-07-04 LAB — COMPREHENSIVE METABOLIC PANEL
ALBUMIN: 2.8 g/dL — AB (ref 3.4–5.0)
ALK PHOS: 136 U/L — AB (ref 46–116)
ALT: 138 U/L — AB (ref 14–63)
ANION GAP: 8 (ref 7–16)
AST: 38 U/L — AB (ref 15–37)
BUN: 19 mg/dL — AB (ref 7–18)
Bilirubin,Total: 0.5 mg/dL (ref 0.2–1.0)
CALCIUM: 8.7 mg/dL (ref 8.5–10.1)
Chloride: 107 mmol/L (ref 98–107)
Co2: 26 mmol/L (ref 21–32)
Creatinine: 0.87 mg/dL (ref 0.60–1.30)
EGFR (African American): >60
EGFR (Non-African Amer.): >60
GLUCOSE: 87 mg/dL (ref 65–99)
Osmolality: 283 (ref 275–301)
POTASSIUM: 3.8 mmol/L (ref 3.5–5.1)
Sodium: 141 mmol/L (ref 136–145)
TOTAL PROTEIN: 6 g/dL — AB (ref 6.4–8.2)

## 2014-07-26 ENCOUNTER — Ambulatory Visit: Payer: Self-pay | Admitting: Family

## 2014-09-06 ENCOUNTER — Ambulatory Visit
Admit: 2014-09-06 | Disposition: A | Discharge status: Home / Self Care | Payer: Self-pay | Attending: Family | Admitting: Family

## 2014-09-07 DIAGNOSIS — I5032 Chronic diastolic (congestive) heart failure: Secondary | ICD-10-CM | POA: Insufficient documentation

## 2014-09-30 NOTE — H&P (Signed)
PATIENT NAME:  Erin [name], Erin [name] MR#:  [id number] DATE OF BIRTH:  [birth year]  DATE OF ADMISSION:  07/03/2014  REFERRING PHYSICIAN: Marjean Donna, MD  PRIMARY CARE PHYSICIAN: Dr. Hoy Morn ([facility])  ADMITTING PHYSICIAN: Azucena Freed, MD  CHIEF COMPLAINT: Shortness of breath which increased since last evening.    HISTORY OF PRESENT ILLNESS: A 74 year old pleasant Caucasian female with a past medical history of hypertension presents with the complaints of increasing shortness of breath which increased last evening. The patient recently went to [place], [place] over the weekend and there she got sick with shortness of breath and  bilateral lower extremity swelling for which she went to the local hospital and was admitted  in [place], [place] and was found to have congestive heart failure and was also noted to have new onset atrial fibrillation. The patient was admitted for 2 days in [place], [place], was treated with IV Cardizem drip following which she converted back to normal sinus rhythm spontaneously. She was treated with IV diuretics and was placed on propafenone and Eliquis and discharged with advice to follow up with her cardiologist, Dr. Clayborn Bigness. She in fact has an appointment with her cardiologist this afternoon, but since her symptoms increased last night she came to the Emergency Room for further evaluation. The patient was evaluated by the ED physician in the Emergency Room and was found to have bilateral pulmonary congestion with pulmonary enema, was given IV furosemide and oxygen supplementation following which her shortness of breath is improving. The patient denies any chest pain. No dizziness or loss of consciousness. No palpitations. No recent fever or cough. No nausea, vomiting, diarrhea. No urinary symptoms. Currently the patient's EKG is normal sinus rhythm and cardiac monitor also shows normal sinus rhythm.   PAST MEDICAL HISTORY: 1.  Hypertension.  2.   Arthritis.   PAST SURGICAL HISTORY: 1.  Bilateral hip replacement.  2.  Bilateral cataract surgery.  3.  Sinuplasty done about 3 weeks ago.   ALLERGIES:  1.  TRAMADOL.  2.  PENICILLIN.   SOCIAL HISTORY: She is divorced, lives alone. No history of smoking, occasional alcohol usage and no history of any substance abuse.  FAMILY HISTORY: Significant for brother with lung cancer and mother with rheumatic heart disease.  HOME MEDICATIONS: 1.  Eliquis 5 mg 1 tablet 2 times a day which was started 2 days ago.  2.  Aspirin 81 mg tablet 1 tablet orally once a day.  3.  Fexofenadine 180 mg 1 tablet orally once a day.  4.  Lisinopril 40 mg tablet 1 tablet orally once a day.  5.  Methocarbamol 500 mg tablet 1 tablet orally as needed.  6.  Metoprolol 1 tablet orally 2 times a day. The dose is not known.  7.  Propafenone 1-1/2 tablets orally every 8 hours, which was started a couple of days ago during her recent admission in [place]. 8.  Alprazolam 0.25 mg 1 tablet orally once a day as needed.  9.  Singulair 10 mg 1 tablet orally once a day.  10.  Trazodone 50 mg 1 tablet orally once a day at bedtime as needed for insomnia.   REVIEW OF SYSTEMS: CONSTITUTIONAL: Negative for fever, chills. No fatigue. No generalized weakness.  EYES: Negative for blurred vision, double vision. No pain. No redness. No discharge.  EARS, NOSE, AND THROAT: Negative for tinnitus, ear pain, hearing loss, epistaxis or nasal discharge.  RESPIRATORY: Negative for cough, wheezing, dyspnea, hemoptysis, or painful respiration.  CARDIOVASCULAR:  Negative for chest pain or palpitations. No dizziness, syncopal episodes. Positive for shortness of breath. She did have some shortness of breath with increasing pedal edema when she was in [place] over the weekend, 4 days ago. Currently leg swelling is under control.  GASTROINTESTINAL: Negative for nausea, vomiting, diarrhea, abdominal pain, hematemesis, melena, or rectal  bleeding.  GENITOURINARY: Negative for dysuria, frequency, urgency, or hematuria.  ENDOCRINE: Negative for polyuria, nocturia, heat or cold intolerance.  HEMATOLOGIC AND LYMPHATIC: Negative for anemia, easy bruising or bleeding.  INTEGUMENTARY: Negative for acne, skin rash, or lesions.  MUSCULOSKELETAL: History of chronic arthritis which is stable. No acute pain at this time.  NEUROLOGICAL: Negative for focal weakness or numbness. No history of CVA, TIA, seizure disorder. PSYCHIATRIC: Negative for anxiety, depression. History of insomnia for which she takes trazodone as needed at bedtime.   PHYSICAL EXAMINATION: VITAL SIGNS: Temperature 97.8 degrees Fahrenheit, pulse 75 per minute, respirations 18 per minute, blood pressure on arrival 158/98. Current blood pressure 132/77. O2 saturation 96% on room.  GENERAL: Well-developed, well-nourished, alert and oriented, in no acute distress, comfortably resting in bed, pleasant and cooperative.  HEAD: Atraumatic, normocephalic.  EYES: Pupils are equal and react to light and accommodation. No conjunctival pallor. No icterus. Extraocular movements intact.  NOSE: No drainage. No lesions.  EARS: No drainage. No external lesions.  ORAL CAVITY: No mucosal lesions. No exudates.  NECK: Supple. No JVD. No thyromegaly. No carotid bruits. Range of motion of neck normal.  RESPIRATORY: Good respiratory effort. Not using accessory muscles of respiration. Bilateral vesicular breath sounds present. Bilateral few rales. No rhonchi present.  CARDIOVASCULAR: S1, S2 regular. No murmurs, gallops or clicks. Peripheral pulses equal in carotid, femoral, and pedal pulses. No peripheral edema.  GASTROINTESTINAL: Abdomen is soft and nontender. No hepatosplenomegaly. No masses. No rigidity. No guarding. Bowel sounds present and equal in all 4 quadrants.  GENITOURINARY: Deferred.  MUSCULOSKELETAL: No joint tenderness or effusion. Range of motion is adequate. Strength and tone equal  bilaterally.  SKIN: Inspection within normal limits. No obvious wounds.  LYMPHATIC: No cervical lymphadenopathy.  VASCULAR: Good dorsalis pedis and posterior tibial pulses.  NEUROLOGIC: Alert, awake, and oriented x3. Cranial nerves II through XII grossly intact. No sensory deficit. Motor strength 5/5 in both upper and lower extremities. DTRs 2+ bilateral and symmetrical. Plantars downgoing.  PSYCHIATRIC: Alert, awake, and oriented x3. Judgment and insight adequate. Memory and mood within normal limits.   DIAGNOSTIC DATA: BNP 2364. Serum glucose 101, BUN 23, creatinine 0.99, sodium 140, potassium 4.2, chloride 107, bicarb 22, total protein 7, albumin 3.2, total bili 0.5, alk phos 197, AST 82, ALT 215. CK total 17. CK/MB less than 0.5. Troponin 0.03. WBC 9.1, hemoglobin 11.7, hematocrit 35.1. Prothrombin time 15.1. INR 1.2. Urinalysis unremarkable.   Chest x-ray: Interval cardiac enlargement, development of vascular congestion, mild pulmonary edema, bilateral pleural effusions, and associated bibasilar atelectasis. No pneumothorax. No pneumonia.   EKG: Normal sinus rhythm with ventricular rate of 76 beats per minute. No acute ST-T changes.   ASSESSMENT AND PLAN: A 74 year old Caucasian female with past medical history of hypertension, recent admission in [place], [place] with new onset congestive heart failure& new onset atrial fibrillation, discharged after 2 day stay, presents with complaints of increasing shortness of breath, which started last evening.  1.  Congestive heart failure, decompensated, diastolic dysfunction. Recent echocardiogram done in [place], [place] 2 days ago showed ejection fraction of 65%. The patient was recently admitted in [place] over the weekend  for 2 days with new onset congestive heart failure and new onset atrial fibrillation. Was discharged home on furosemide, Eliquis and propafenone.   Plan: Admit to telemetry. IV furosemide 40 mg b.i.d. Followup  potassium levels. Oxygen supplementation. Cycle cardiac enzymes. Continue aspirin, ACE inhibitor. Request cardiology evaluation. The patient would like to follow with her cardiologist, Dr. Clayborn Bigness. Echocardiogram is not requested at this time since the patient had recent echocardiogram done in [place], [place] and as per the echocardiogram ejection fraction is 65%.  2.  Hypertension. Controlled on home medications. Continue same.  3.  Recent episode of atrial fibrillation, diagnosed during her recent stay in [place]. The patient was started on propafenone and Eliquis, which she has been taking for the past couple of days. The patient currently in sinus rhythm with controlled rate. Continue propafenone and  Eliquis for now and follow up on cardiology recommendations.  4.  Elevated liver functions. No history of any chronic liver disease in the past. Most likely elevated liver functions could be vascular congestion from congestive heart failure. Plan: We will request right upper quadrant sonogram to evaluate for any underlying liver disease. Monitor LFTs.  5.  Deep vein thrombosis prophylaxis. Continue Eliquis. 6.  Gastrointestinal prophylaxis. Proton pump inhibitor.   CODE STATUS: FULL code.   TIME SPENT: 55 minutes.   ____________________________ Juluis Mire, MD enr:sb D: 07/03/2014 07:38:04 ET T: 07/03/2014 08:10:15 ET JOB#: [id number]  cc: Juluis Mire, MD, <Dictator> Dr. Hoy Morn - [facility] Ulice Brilliant Marsheila Alejo MD ELECTRONICALLY SIGNED 07/05/2014 19:15

## 2014-09-30 NOTE — Consult Note (Signed)
Chief Complaint:  Subjective/Chief Complaint Patient states improve chest pain no dizziness no shortness of breath denies any palpitations   VITAL SIGNS/ANCILLARY NOTES: **Vital Signs.:   03-Feb-16 07:47  Vital Signs Type Q 4hr  Temperature Temperature (F) 97.9  Celsius 36.6  Temperature Source oral  Pulse Pulse 68  Respirations Respirations 17  Systolic BP Systolic BP 485  Diastolic BP (mmHg) Diastolic BP (mmHg) 62  Mean BP 75  Pulse Ox % Pulse Ox % 95  Pulse Ox Activity Level  At rest  Oxygen Delivery 2L  *Intake and Output.:   Daily 03-Feb-16 07:00  Grand Totals Intake:  360 Output:  1700    Net:  -1340 24 Hr.:  -1340  Oral Intake      In:  360  Urine ml     Out:  1700  Length of Stay Totals Intake:  360 Output:  1700    Net:  -1340   Brief Assessment:  GEN well developed, well nourished, no acute distress   Cardiac Regular   Respiratory normal resp effort  clear BS   Gastrointestinal Normal   Gastrointestinal details normal Soft   EXTR negative cyanosis/clubbing, negative edema   Lab Results: Hepatic:  03-Feb-16 03:47   Bilirubin, Total 0.5  Alkaline Phosphatase  136  SGPT (ALT)  138  SGOT (AST)  38  Total Protein, Serum  6.0  Albumin, Serum  2.8  Cardiology:  03-Feb-16 09:05   Protocol LEXISCAN  Max Work Load 10  Total Exercise Time 60  Max Diastolic BP 56  Max Heart Rate 86  Max Predicted Heart Rate 145  Reason For Termination per lexiscan protocol  ECG interpretation Confirmed by OVERREAD, NOT ([id number]), editor PEARSON, BARBARA ([id number]) on 07/04/2014 10:22:30 AM  Routine Chem:  03-Feb-16 03:47   Glucose, Serum 87  BUN  19  Creatinine (comp) 0.87  Sodium, Serum 141  Potassium, Serum 3.8  Chloride, Serum 107  CO2, Serum 26  Calcium (Total), Serum 8.7  Osmolality (calc) 283  eGFR (African American) >60  eGFR (Non-African American) >60 (eGFR values <67mL/min/1.73 m2 may be an indication of chronic kidney disease (CKD). Calculated eGFR, using the  MRDR Study equation, is useful in  patients with stable renal function. The eGFR calculation will not be reliable in acutely ill patients when serum creatinine is changing rapidly. It is not useful in patients on dialysis. The eGFR calculation may not be applicable to patients at the low and high extremes of body sizes, pregnant women, and vegetarians.)  Anion Gap 8   Radiology Results: XRay:    02-Feb-16 05:24, Chest Portable Single View  Chest Portable Single View   REASON FOR EXAM:    Chest pain  COMMENTS:       PROCEDURE: DXR - DXR PORTABLE CHEST SINGLE VIEW  - Jul 03 2014  5:24AM     CLINICAL DATA:  Shortness of breath.    EXAM:  PORTABLE CHEST - 1 VIEW    COMPARISON:  07/13/2013    FINDINGS:  Interval cardiac enlargement. Development of vascular congestion  mild pulmonary edema. There are bilateral pleural effusions and  associated bibasilar atelectasis. No confluent airspace disease to  suggest pneumonia. No pneumothorax. No acute osseous abnormality.     IMPRESSION:  Findings consistent with CHF.      Electronically Signed    By: Jeb Levering M.D.    On: 07/03/2014 06:35         Verified By: Rollene Fare. Marisue Humble, M.D.,  [place]:    03-Feb-16 10:56, US Abdomen General Survey  US Abdomen General Survey   REASON FOR EXAM:    elevated lfts  COMMENTS:       PROCEDURE: [place]  - US ABDOMEN GENERAL SURVEY  - Jul 04 2014 10:56AM     CLINICAL DATA:  Elevated liver function tests.    EXAM:  ULTRASOUND ABDOMEN COMPLETE    COMPARISON:  None.    FINDINGS:  Gallbladder: No gallstones or wall thickening visualized. No  sonographic Murphy sign noted.  Common bile duct: Diameter: 1.4 mm, normal.    Liver: No focal lesions. Slight increased echogenicity of the liver  parenchyma suggesting hepatic steatosis.    IVC: No abnormality visualized.    Pancreas: Visualized portion unremarkable.    Spleen: 4.6 cm, normal.    Right Kidney: Length: 10.6 cm. Echogenicity  within normal limits. No  mass or hydronephrosis visualized.    Left Kidney: Length: 10.3 cm. Echogenicity within normal limits. No  mass or hydronephrosis visualized.    Abdominal aorta: No aneurysm visualized. Atherosclerotic disease.  1.7 cm maximum diameter.    Other findings: Small right and moderate left pleural effusion.     IMPRESSION:  Probablehepatic steatosis.  Bilateral pleural effusions.      Electronically Signed    By: Rozetta Nunnery M.D.    On: 07/04/2014 11:03     Verified By: Larey Seat, M.D.,  Cardiology:    02-Feb-16 05:05, ED ECG  Ventricular Rate 76  Atrial Rate 76  P-R Interval 154  QRS Duration 86  QT 434  QTc 488  P Axis 40  R Axis 5  T Axis 6  ECG interpretation   Normal sinus rhythm  Septal infarct , age undetermined  Abnormal ECG  No previous ECGs available  ----------unconfirmed----------  Confirmed by OVERREAD, NOT ([id number]), editor PEARSON, BARBARA ([id number]) on 07/03/2014 2:22:12 PM  ED ECG     03-Feb-16 09:05, Stress Test For Nuclear  Protocol LEXISCAN  Max Work Load 10  Total Exercise Time 60  Max Diastolic BP 56  Max Heart Rate 86  Max Predicted Heart Rate 145  Reason For Termination   per lexiscan protocol  ECG interpretation   Confirmed by OVERREAD, NOT ([id number]), editor PEARSON, BARBARA ([id number]) on 07/04/2014 10:22:30 AM  Stress Test   Nuclear Med:    03-Feb-16 10:30, NM MYOCARDIAL SCAN  NM MYOCARDIAL SCAN   REASON FOR EXAM:    CHF, SOB  COMMENTS:       PROCEDURE: NM  - NM MYOCARDIAL SCAN  - Jul 04 2014 10:30AM     RESULT: Nuclear Cardiology Report    Patient Demographics         Name: Erin [name]           Study Date: 07/04/2014   Patient ID: [id number]                Report Date: 07/04/2014          DOB: [birth year] Age: 74 years       Height:          Sex: F                             Weight:  Accession #: [id number]                        Race:    Ordering  Physician: Juluis Mire MD  Diagnosing Physician: [id number] Lujean Amel  MD  Indications  The patient was imaged for the following indications:  Rist stattification - post MI/preoperative/general.    Clinical History  2.74 year old F with no known coronary artery disease.  Cardiac risk factors include: hypertension, hypercholesterolemia, age and   Hx Smoking, SOB.  Images were obtained using Rest Tc-34m/stress Tc-27m 1 day protocol.    Procedure  Pharm stress protocol was followed due to the patient was unable to   exercise. Patient was injected with .4 mg Regadenoson.  Inadequate level of exercise.  Myocardial perfusion imaging was performed following the injection of     13.64 MCi of 61mTc Sestamibi at 07:58.  The patient was injected with 32.88 MCi of 32mTc Sestamibi at 09:13 for   the stress portion of the exam.    Stress Test Findings    The following table summarizes the findings:  +-------------+-------------------+-------------------+                 STRESS EKG DATA     REST EKG DATA     +-------------+-------------------+-------------------+  Test Status  Normal             Normal               +-------------+-------------------+-------------------+  Rhythm       Normal sinus rhythmNormal sinus rhythm  +-------------+-------------------+-------------------+  IV ConductionNormal             Normal               +-------------+-------------------+-------------------+  Arrhythmias                     None                 +-------------+-------------------+-------------------+  ST Response  Normal             +-------------+-------------------+-------------------+    Overall Impression  The overall study imaging quality was deemed to be good.  The left ventricular global function was normal.  This myocardial perfusion scan showed no evidence of pathology and has a   normal appearance. The stress to rest volume ratio is 1.16 for the left   ventricle.  Pharmacological myocardial perfusion study with no significant ischemia.  There is no  artifact noted on this study.  The estimated ejection fraction is 80%.  There are EKG changes concerning for ischemia.  This is a normal study.    Summary   1. Pharmacological myocardial perfusion study with no significant   ischemia.   2. The estimated ejection fraction is 80%.   3. The left ventricularglobal function was normal.   4. There are EKG changes concerning for ischemia.   5. There is no artifact noted on this study.   6. This is a normal study.  Diagnosing Physician: Sterling MD  Electronically signed at 2:14:27 PM on 07/04/2014    *** Final ***  IMPRESSION: .        Verified By: Yolonda Kida, M.D., MD   Assessment/Plan:  Assessment/Plan:  Assessment congestive heart failure  atrial fibrillation  hypertension  DJD  shortness of breath  murmur  possible angina equivalent .   Plan proceed with Lexis scan Myoview today  continue Eliquis for atrial fibrillation  continue sotalol for antiarrhythmic control  discontinue beta-blocker  continue blood pressure control  low-dose diuretic therapy for congestive heart failure symptoms  if Myoview is negative to  have the patient discharged home for follow-up as an outpatient  consider cardiac catheterization for possible angina equivalent   Electronic Signatures: Lujean Amel D (MD)  (Signed 03-Feb-16 18:00)  Authored: Chief Complaint, VITAL SIGNS/ANCILLARY NOTES, Brief Assessment, Lab Results, Radiology Results, Assessment/Plan   Last Updated: 03-Feb-16 18:00 by Lujean Amel D (MD)

## 2014-09-30 NOTE — Discharge Summary (Signed)
PATIENT NAME:  FLANNERY, CAVALLERO MR#:  [id number] DATE OF BIRTH:  [birth year]  DATE OF ADMISSION:  07/03/2014 DATE OF DISCHARGE:  07/04/2014  For a detailed note please check the history and physical done on admission by Dr. Betti Cruz.   DIAGNOSES AT DISCHARGE:  1. Acute congestive heart failure likely acute on chronic diastolic dysfunction.  2. Atrial fibrillation with rapid ventricular response.  3. Hypertension.  4. Hyperlipidemia.  5. Gastroesophageal reflux disease.  6. Abnormal LFTs.   The patient is being discharged on a low-sodium, low-fat diet. Activity is as tolerated.  Follow up with Dr. Juliann Pares in the next 1-2 weeks.   DISCHARGE MEDICATIONS: Aspirin 81 mg daily, clindamycin as needed before dental procedures, Allegra 180 mg daily, methocarbamol 500 mg daily, trazodone 50 mg at bedtime, Eliquis 5 mg b.i.d., Xanax 0.25 mg as needed for dental procedures, Singulair 10 mg daily as needed, sotalol 80 mg b.i.d., lovastatin 20 mg daily, Lasix 40 mg daily, potassium 10 mEq daily.   CONSULTANTS DURING THE HOSPITAL COURSE: Dr. Juliann Pares from cardiology.   PERTINENT STUDIES DONE DURING THE HOSPITAL COURSE: A chest x-ray done on admission showing findings consistent with CHF. An ultrasound of the abdomen showing hepatic steatosis. A nuclear medicine myocardial scan done on February 3 showing EF of 80%, pharmacological myocardial perfusion study with no significant ischemia, no artifact noted, this is a normal study.   BRIEF HOSPITAL COURSE: This is a 74 year old female who presented to the hospital with shortness of breath and lower extremity edema, noted to be in congestive heart failure.   1.  CHF. This was likely acute on chronic diastolic CHF. The patient was admitted to the hospital, started on IV diuretics, and has responded well to it. Clinically feels much better, has lower extremity edema and shortness of breath have improved. She was also maintained on her beta blocker and inhibitor. The  patient was seen by cardiology who agreed with this management. The exact cause of the CHF is unclear, but thought to be a possible combination of atrial fibrillation versus myocardial ischemia, although the patient's Myoview was essentially negative. At this point the patient is being discharged on oral Lasix along with sotalol for management of her atrial fibrillation. She has also been started on a statin as mentioned. She will continue that along with her Eliquis as stated.  2.  History of chronic atrial fibrillation. The patient initially was slightly tachycardic, converted to a normal sinus rhythm after getting some pulse doses of Cardizem in the hospital. She was just recently diagnosed with atrial fibrillation maybe a few weeks prior to admission. She was on propafenone and metoprolol, although that was giving her significant headaches. The patient was seen by cardiology. They switched her to sotalol which is what she is being discharged on for rate control. She will also continue Eliquis for anticoagulation as mentioned.  3.  Elevated LFTs. This is likely secondary to chronic passive congestion from her CHF.  Her LFTs did trend down. Her abdominal ultrasound showed just hepatic steatosis. This can be further followed as an outpatient.  4.  Hyperlipidemia. The patient's total cholesterol is over 200. I did discharge her on lovastatin. 5.  GERD. The patient was maintained on some Protonix. She will resume that.  6.  Anxiety. The patient was maintained on her Xanax and she will also resume that upon discharge.   CODE STATUS: The patient is a full code.   TIME SPENT ON THE DISCHARGE: 40 minutes.  ____________________________ Rolly PancakeVivek J. Cherlynn KaiserSainani, MD vjs:bu D: 07/04/2014 15:51:15 ET T: 07/04/2014 19:17:05 ET JOB#: [id number]  cc: Rolly PancakeVivek J. Cherlynn KaiserSainani, MD, <Dictator> Dwayne D. Juliann Paresallwood, MD [place] SirenVIVEK J Kippy Gohman MD ELECTRONICALLY SIGNED 07/14/2014 12:53

## 2014-09-30 NOTE — Consult Note (Signed)
PATIENT NAME:  Erin [name], Erin [name] MR#:  [id number] DATE OF BIRTH:  02-22-1941  DATE OF CONSULTATION:  07/03/2014  REFERRING PHYSICIAN:  Crissie FiguresEdavally N. Reddy, MD  CONSULTING PHYSICIAN:  Georg Ang D. Juliann Paresallwood, MD  PRIMARY CARE PHYSICIAN: Letitia CaulHeidi M. Grandis, MD   INDICATION FOR CONSULTATION: Shortness of breath, congestive heart failure.   HISTORY OF PRESENT ILLNESS: Erin [name] is a 74 year old white female with past history of hypertension, recently seen in [place] with atrial fibrillation, congestive heart failure symptoms. She has had increasing shortness of breath and discomfort. The patient was recently in [place] visiting with her son and got sick with acute palpitations, tachycardia, bilateral leg edema. At a local hospital in [place], the patient was found to have congestive heart failure and new onset atrial fibrillation. The patient was admitted for 2 days, treated with IV Cardizem and came back to normal sinus rhythm. The patient was treated with IV diuretics and placed on propafenone, which she states gave her headaches and Eliquis for anticoagulation. The patient was doing reasonably well, has an appointment to see me today, but woke up in the middle of the night at about 3:00 a.m. with shortness of breath and congestion and bilateral pulmonary congestion. The patient was given IV Lasix and oxygen for shortness of breath. Denied any pain. No dizziness or loss of consciousness. Swelling was improved in her legs. She denied any palpitation. No recent cough. No nausea, vomiting, or diarrhea, but had some mild urinary symptoms. EKG showed sinus rhythm when EMS picked her up.   PAST MEDICAL HISTORY: Hypertension, arthritis, atrial fibrillation, shortness of breath, and DJD.   PAST SURGICAL HISTORY: Bilateral hip replacement, bilateral cataract surgery, sinus surgery.   ALLERGIES: TRAMADOL, PENICILLIN.   SOCIAL HISTORY: Divorced, lives alone, a retired Engineer, civil (consulting)nurse. No substance abuse. No alcohol  consumption.   FAMILY HISTORY: Lung cancer, rheumatic heart disease.   HOME MEDICATIONS: Eliquis twice a day, aspirin 81 mg once a day, fexofenadine 180 mg once a day, lisinopril 40 mg a day, methocarbamol 500 mg once a day, metoprolol orally 2 tablets a day, propafenone 1-1/2 tablets every 8 hours, Xanax 0.25 once a day, Singulair 10 mg a day, trazodone 50 mg at bedtime.   REVIEW OF SYSTEMS:  CONSTITUTIONAL: No blackout spells or syncope.  GASTROINTESTINAL:  No nausea or vomiting. No fever. No chills, no sweats. No weight loss. No weight gain. No hemoptysis or hematemesis.  GENITOURINARY: No bright red blood per rectum.  HEENT: No vision change or hearing change.  RESPIRATORY: Denies sputum production or cough.  CARDIOVASCULAR: She has had congestion and shortness of breath, palpitations, tachycardia, weakness, fatigue.   PHYSICAL EXAMINATION: VITAL SIGNS: Blood pressure 150/90, pulse 75, respiratory rate 16, afebrile.  HEENT: Normocephalic, atraumatic. Pupils equal and reactive to light.  NECK: Supple. No significant JVD, bruits, or adenopathy.  LUNGS: Clear to auscultation. No significant wheezing, rhonchi, or rales.  HEART: Regular rate and rhythm.  ABDOMEN: Positive bowel sounds. No rebound, guarding or tenderness.  EXTREMITIES: Within normal limits.  NEUROLOGIC: Intact.  SKIN: Normal.   LABORATORY DATA: BNP 2300, glucose 101, BUN 23, creatinine 0.99, sodium 140, potassium 4.2, chloride 107, bicarbonate 22, AST 82, ALT 215. Cardiac enzymes are negative. White count of 9.1, hemoglobin 11.7, hematocrit 35. ProTime is 15.1, INR 1.2. Urinalysis is negative. Chest x-ray: Cardiomegaly, vascular congestion, pulmonary edema, bilateral pleural effusions with atelectasis. No pneumothorax. No pneumonia. EKG: Normal sinus rhythm with a ventricular rate of 75. Nonspecific ST or T-wave changes.  ASSESSMENT: Congestive heart failure, atrial fibrillation, shortness of breath, murmur, slightly  elevated liver function tests, headaches, sinus trouble, and arthritis.   PLAN: Agree with IV Lasix. Continue potassium supplementation. Agree with anticoagulation, either continue Eliquis or switch to Coumadin because of expense. Follow up cardiac enzymes. Continue aspirin therapy. Recommend ACE inhibitors and possible low-dose beta blockers. Will probably switch to sotalol for rhythm control. Recommend long-term anticoagulation, as well as blood pressure control. Would consider recurrent bouts of pulmonary edema, may need cardiac catheterization at some point for further evaluation. She has not complained of any recent anginal symptoms, but may need further evaluation because of recurrent pulmonary edema. She thinks it may be related to lack of diuretics, but I am concerned that she may have underlying coronary artery disease as the etiology for shortness of breath. Would probably start her on sotalol for heart rate and rhythm control, and hopefully be able to discontinue her metoprolol.     ____________________________ Bobbie Stack. Juliann Pares, MD ddc:mw D: 07/03/2014 15:56:00 ET T: 07/03/2014 17:04:25 ET JOB#: [id number]  cc: Stanislaus Kaltenbach D. Juliann Pares, MD, <Dictator> Alwyn Pea MD ELECTRONICALLY SIGNED 07/10/2014 17:27

## 2015-01-15 ENCOUNTER — Ambulatory Visit: Payer: Medicare Other | Attending: Family | Admitting: Family

## 2015-01-15 ENCOUNTER — Encounter: Payer: Self-pay | Admitting: Family

## 2015-01-15 VITALS — BP 142/47 | HR 52 | Resp 20 | Ht 60.0 in | Wt 113.0 lb

## 2015-01-15 DIAGNOSIS — I5032 Chronic diastolic (congestive) heart failure: Secondary | ICD-10-CM | POA: Diagnosis not present

## 2015-01-15 DIAGNOSIS — R001 Bradycardia, unspecified: Secondary | ICD-10-CM | POA: Diagnosis not present

## 2015-01-15 DIAGNOSIS — Z87891 Personal history of nicotine dependence: Secondary | ICD-10-CM | POA: Diagnosis not present

## 2015-01-15 DIAGNOSIS — E871 Hypo-osmolality and hyponatremia: Secondary | ICD-10-CM | POA: Insufficient documentation

## 2015-01-15 DIAGNOSIS — K219 Gastro-esophageal reflux disease without esophagitis: Secondary | ICD-10-CM | POA: Diagnosis not present

## 2015-01-15 DIAGNOSIS — I4891 Unspecified atrial fibrillation: Secondary | ICD-10-CM | POA: Insufficient documentation

## 2015-01-15 DIAGNOSIS — M1990 Unspecified osteoarthritis, unspecified site: Secondary | ICD-10-CM | POA: Diagnosis not present

## 2015-01-15 DIAGNOSIS — Z79899 Other long term (current) drug therapy: Secondary | ICD-10-CM | POA: Insufficient documentation

## 2015-01-15 DIAGNOSIS — E785 Hyperlipidemia, unspecified: Secondary | ICD-10-CM | POA: Diagnosis not present

## 2015-01-15 NOTE — Patient Instructions (Signed)
Continue weighing daily and call for an overnight weight gain of > 2 pounds or a weekly weight gain of >5 pounds. 

## 2015-01-15 NOTE — Progress Notes (Signed)
Subjective:    Patient ID: Erin [name], female    DOB: Dec 13, 1940, 74 y.o.   MRN: [id number]  Congestive Heart Failure Presents for follow-up visit. The disease course has been stable. Associated symptoms include fatigue. Pertinent negatives include no chest pain, chest pressure, edema, orthopnea, palpitations or shortness of breath. The symptoms have been stable. Past treatments include salt and fluid restriction, beta blockers and exercise. The treatment provided significant relief. Compliance with prior treatments has been good. Her past medical history is significant for arrhythmia and HTN. There is no history of CAD, chronic lung disease or DM. Compliance with total regimen is 76-100%.  Other Chronicity: bradycardia. The current episode started more than 1 year ago. The problem occurs daily. The problem has been unchanged. Associated symptoms include fatigue. Pertinent negatives include no chest pain, congestion, coughing, headaches, nausea, neck pain, sore throat or weakness. Nothing aggravates the symptoms. Treatments tried: decreasing medication. The treatment provided no relief.    Past Medical History  Diagnosis Date  . Hyponatremia 07-13-13    hx. of   . GERD (gastroesophageal reflux disease)   . Arthritis 07-13-13    osteoarthritis  . Abnormal EKG 07-13-13    hx. of -Dr. Juliann Pares, [place], [place]- last Echo [phone] with chart  . Hypertension     lately hypotension, 12'14-Amlodipine stopped, then improved  . Hyperlipidemia   . CHF (congestive heart failure)   . Arrhythmia     Atrial Fib   Past Surgical History  Procedure Laterality Date  . Tonsillectomy    . Cataract extraction Bilateral   . Joint replacement      RTHA  . Breast surgery      lumpectomy-bx(benign)  . Total hip arthroplasty Left 07/19/2013    Procedure: LEFT TOTAL HIP ARTHROPLASTY ANTERIOR APPROACH;  Surgeon: Loanne Drilling, MD;  Location: [facility];  Service: Orthopedics;  Laterality: Left;  . Balloon  sinuplasty    . Cataract extraction, bilateral      No family history on file.  Social History  Substance Use Topics  . Smoking status: Former Smoker    Types: Cigarettes    Quit date: 07/13/1985  . Smokeless tobacco: Not on file  . Alcohol Use: Yes     Comment: rare -occ.    Allergies  Allergen Reactions  . Cefdinir Other (See Comments)    GI Distress  . Penicillins Hives  . Tramadol Other (See Comments)    GI Distress    Prior to Admission medications   Medication Sig Start Date End Date Taking? Authorizing Provider  ALPRAZolam Prudy Feeler) 0.25 MG tablet Take 0.25 mg by mouth as needed for anxiety (For dental procedures).   Yes Historical Provider, MD  apixaban (ELIQUIS) 2.5 MG TABS tablet Take 2.5 mg by mouth 2 (two) times daily.   Yes Historical Provider, MD  aspirin EC 81 MG tablet Take 81 mg by mouth daily.   Yes Historical Provider, MD  clindamycin (CLEOCIN) 300 MG capsule Take 600 mg by mouth as needed (before dental procedures).   Yes Historical Provider, MD  fexofenadine (ALLEGRA) 180 MG tablet Take 180 mg by mouth daily.   Yes Historical Provider, MD  furosemide (LASIX) 40 MG tablet Take 40 mg by mouth daily.   Yes Historical Provider, MD  lovastatin (MEVACOR) 20 MG tablet Take 20 mg by mouth at bedtime.   Yes Historical Provider, MD  potassium chloride (K-DUR,KLOR-CON) 10 MEQ tablet Take 10 mEq by mouth daily.   Yes Historical Provider, MD  sotalol (BETAPACE) 80 MG tablet Take 40 mg by mouth daily.    Yes Historical Provider, MD     Review of Systems  Constitutional: Positive for fatigue. Negative for appetite change.  HENT: Negative for congestion, postnasal drip and sore throat.   Eyes: Negative.   Respiratory: Negative for cough, chest tightness, shortness of breath and wheezing.   Cardiovascular: Negative for chest pain, palpitations and leg swelling.  Gastrointestinal: Negative.  Negative for nausea.  Endocrine: Negative.   Genitourinary: Negative.    Musculoskeletal: Negative for back pain and neck pain.  Skin: Negative.   Allergic/Immunologic: Negative.   Neurological: Negative for weakness, light-headedness and headaches.  Hematological: Negative for adenopathy. Bruises/bleeds easily.  Psychiatric/Behavioral: Negative for sleep disturbance and dysphoric mood. The patient is not nervous/anxious.        Objective:   Physical Exam  Constitutional: She is oriented to person, place, and time. She appears well-developed and well-nourished.  HENT:  Head: Normocephalic and atraumatic.  Eyes: Conjunctivae are normal. Pupils are equal, round, and reactive to light.  Neck: Normal range of motion. Neck supple.  Cardiovascular: An irregularly irregular rhythm present. Bradycardia present.   Pulmonary/Chest: Effort normal. No respiratory distress. She has no wheezes. She has no rales.  Abdominal: Soft. She exhibits no distension. There is no tenderness.  Musculoskeletal: She exhibits no edema or tenderness.  Neurological: She is alert and oriented to person, place, and time.  Skin: Skin is warm and dry. No erythema.  Psychiatric: She has a normal mood and affect. Her behavior is normal. Thought content normal.  Nursing note and vitals reviewed.   BP 142/47 mmHg  Pulse 52  Resp 20  Ht 5' (1.524 m)  Wt 113 lb (51.256 kg)  BMI 22.07 kg/m2  SpO2 100%       Assessment & Plan:  1: Chronic heart failure with preserved ejection fraction- Patient presents without any shortness of breath, edema or weight gain. She does endorse a little bit of fatigue but is able to do all her activities without assistance. She was reminded to call for an overnight weight gain of >2 pounds or a weekly weight gain of >5 pounds. She is not adding any salt to her food and is trying to closely follow a low sodium diet.  2: Bradycardia- She says that her heart rate continues to stay in the 50's and does dip down into the 40's at times. Has been keeping her  cardiologist informed and her sotalol is now down to 40mg  daily. She has a followup appointment with him on 01/24/15.  3: Hyperlipidemia- She brought in her most recent lab results and her lipid panel looks very good. HDL is 98 with a LDL of 151.   Patient feels like she's doing well from a heart failure standpoint and would like to return only if she needs to . Advised patient that she could call or return any time she needs anything from [place].

## 2015-11-29 IMAGING — CR DG PORTABLE PELVIS
1 series · 1 of 1 positions shown · non-contrast
Comparison: 11/22/2006

CLINICAL DATA: Left hip replacement.

EXAM:
PORTABLE PELVIS 1-2 VIEWS

[AP]
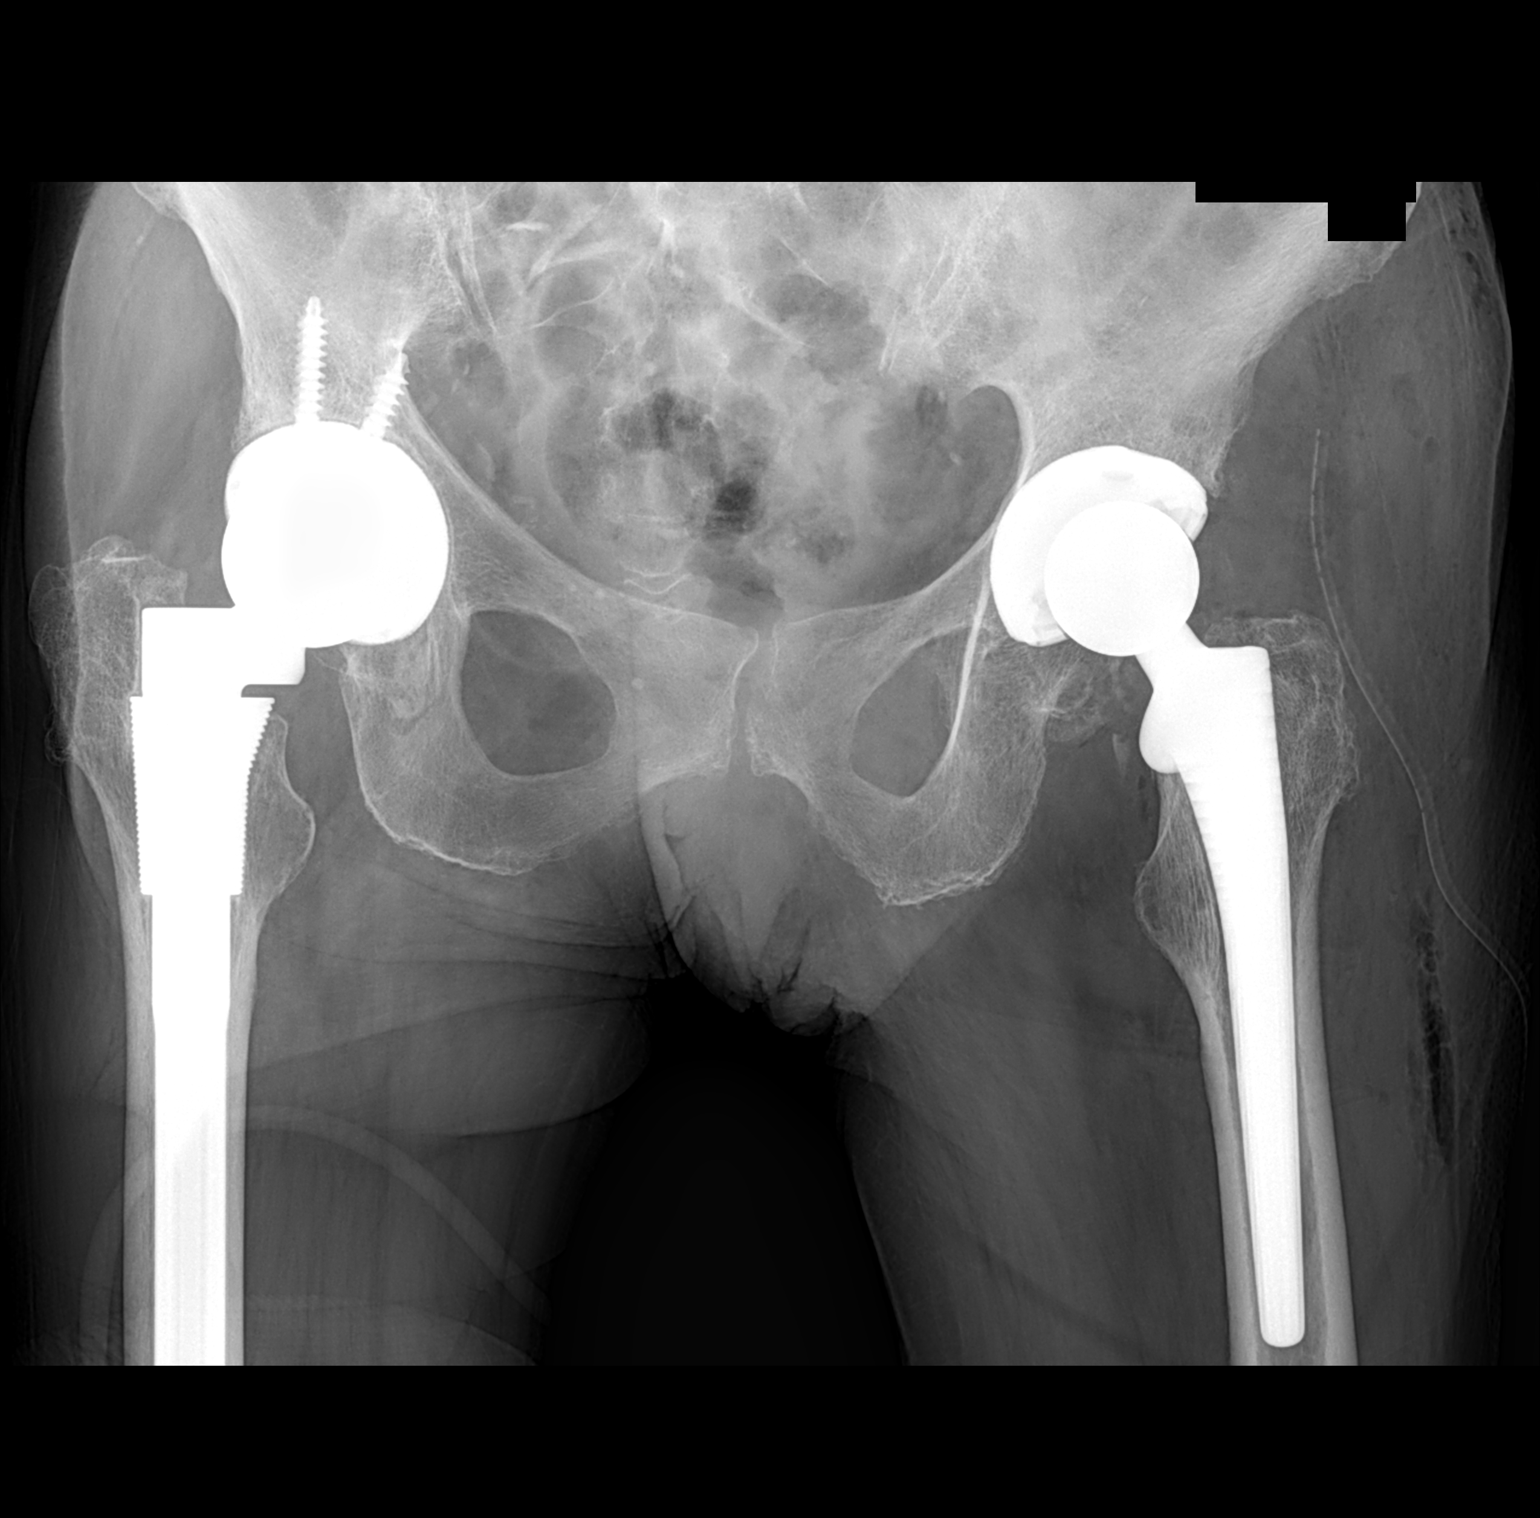

[1 of 1 positions shown; findings below may reference images not displayed]

FINDINGS: Portable AP view of the pelvis shows the patient to be status post
interval left total hip replacement. No evidence for immediate
hardware complications. Surgical drain overlies the lateral soft
tissues. Gas in the soft tissues of the left hip region is
consistent with the postoperative state of this individual.
IMPRESSION: Status post left total hip replacement. No evidence for immediate
hardware complications.
